# Patient Record
Sex: Female | Born: 1945 | Race: White | Hispanic: No | Smoking: Former smoker
Health system: Southern US, Community
[De-identification: ages and names within clinical notes are randomized; demographics above are authoritative.]

## PROBLEM LIST (undated history)

## (undated) DIAGNOSIS — N289 Disorder of kidney and ureter, unspecified: Secondary | ICD-10-CM

## (undated) DIAGNOSIS — E119 Type 2 diabetes mellitus without complications: Secondary | ICD-10-CM

## (undated) DIAGNOSIS — N390 Urinary tract infection, site not specified: Secondary | ICD-10-CM

## (undated) DIAGNOSIS — I639 Cerebral infarction, unspecified: Secondary | ICD-10-CM

---

## 2016-09-18 ENCOUNTER — Encounter (HOSPITAL_COMMUNITY): Payer: Self-pay | Admitting: Emergency Medicine

## 2016-09-18 ENCOUNTER — Inpatient Hospital Stay (HOSPITAL_COMMUNITY)
Admission: EM | Admit: 2016-09-18 | Discharge: 2016-09-20 | DRG: 640 | Disposition: A | Discharge status: Skilled Nursing Facility | Payer: Medicare Other | Source: Skilled Nursing Facility | Attending: Internal Medicine | Admitting: Internal Medicine

## 2016-09-18 ENCOUNTER — Emergency Department (HOSPITAL_COMMUNITY): Payer: Medicare Other

## 2016-09-18 DIAGNOSIS — Z9115 Patient's noncompliance with renal dialysis: Secondary | ICD-10-CM

## 2016-09-18 DIAGNOSIS — Z87891 Personal history of nicotine dependence: Secondary | ICD-10-CM

## 2016-09-18 DIAGNOSIS — R112 Nausea with vomiting, unspecified: Secondary | ICD-10-CM

## 2016-09-18 DIAGNOSIS — Z91040 Latex allergy status: Secondary | ICD-10-CM

## 2016-09-18 DIAGNOSIS — R55 Syncope and collapse: Secondary | ICD-10-CM | POA: Diagnosis present

## 2016-09-18 DIAGNOSIS — J90 Pleural effusion, not elsewhere classified: Secondary | ICD-10-CM | POA: Diagnosis present

## 2016-09-18 DIAGNOSIS — Z794 Long term (current) use of insulin: Secondary | ICD-10-CM

## 2016-09-18 DIAGNOSIS — E118 Type 2 diabetes mellitus with unspecified complications: Secondary | ICD-10-CM

## 2016-09-18 DIAGNOSIS — E876 Hypokalemia: Secondary | ICD-10-CM | POA: Diagnosis present

## 2016-09-18 DIAGNOSIS — Z888 Allergy status to other drugs, medicaments and biological substances status: Secondary | ICD-10-CM

## 2016-09-18 DIAGNOSIS — Z8673 Personal history of transient ischemic attack (TIA), and cerebral infarction without residual deficits: Secondary | ICD-10-CM

## 2016-09-18 DIAGNOSIS — E1122 Type 2 diabetes mellitus with diabetic chronic kidney disease: Secondary | ICD-10-CM | POA: Diagnosis present

## 2016-09-18 DIAGNOSIS — R0602 Shortness of breath: Secondary | ICD-10-CM

## 2016-09-18 DIAGNOSIS — E162 Hypoglycemia, unspecified: Secondary | ICD-10-CM | POA: Diagnosis present

## 2016-09-18 DIAGNOSIS — D649 Anemia, unspecified: Secondary | ICD-10-CM | POA: Diagnosis present

## 2016-09-18 DIAGNOSIS — Z992 Dependence on renal dialysis: Secondary | ICD-10-CM

## 2016-09-18 DIAGNOSIS — F0390 Unspecified dementia without behavioral disturbance: Secondary | ICD-10-CM | POA: Diagnosis present

## 2016-09-18 DIAGNOSIS — E8770 Fluid overload, unspecified: Principal | ICD-10-CM | POA: Diagnosis present

## 2016-09-18 DIAGNOSIS — K5641 Fecal impaction: Secondary | ICD-10-CM | POA: Diagnosis present

## 2016-09-18 DIAGNOSIS — Z87440 Personal history of urinary (tract) infections: Secondary | ICD-10-CM

## 2016-09-18 DIAGNOSIS — E11649 Type 2 diabetes mellitus with hypoglycemia without coma: Secondary | ICD-10-CM | POA: Diagnosis not present

## 2016-09-18 DIAGNOSIS — Z884 Allergy status to anesthetic agent status: Secondary | ICD-10-CM

## 2016-09-18 DIAGNOSIS — N186 End stage renal disease: Secondary | ICD-10-CM

## 2016-09-18 DIAGNOSIS — E875 Hyperkalemia: Secondary | ICD-10-CM | POA: Diagnosis present

## 2016-09-18 HISTORY — DX: Cerebral infarction, unspecified: I63.9

## 2016-09-18 HISTORY — DX: Urinary tract infection, site not specified: N39.0

## 2016-09-18 HISTORY — DX: Type 2 diabetes mellitus without complications: E11.9

## 2016-09-18 HISTORY — DX: Disorder of kidney and ureter, unspecified: N28.9

## 2016-09-18 LAB — CBC WITH DIFFERENTIAL/PLATELET
Basophils Absolute: 0 10*3/uL (ref 0.0–0.1)
Basophils Relative: 0 %
Eosinophils Absolute: 0 10*3/uL (ref 0.0–0.7)
Eosinophils Relative: 0 %
HEMATOCRIT: 40.1 % (ref 36.0–46.0)
HEMOGLOBIN: 12.2 g/dL (ref 12.0–15.0)
LYMPHS ABS: 0.5 10*3/uL — AB (ref 0.7–4.0)
LYMPHS PCT: 5 %
MCH: 31.1 pg (ref 26.0–34.0)
MCHC: 30.4 g/dL (ref 30.0–36.0)
MCV: 102.3 fL — ABNORMAL HIGH (ref 78.0–100.0)
MONOS PCT: 3 %
Monocytes Absolute: 0.3 10*3/uL (ref 0.1–1.0)
NEUTROS ABS: 9.4 10*3/uL — AB (ref 1.7–7.7)
NEUTROS PCT: 92 %
Platelets: 202 10*3/uL (ref 150–400)
RBC: 3.92 MIL/uL (ref 3.87–5.11)
RDW: 15.6 % — ABNORMAL HIGH (ref 11.5–15.5)
WBC: 10.2 10*3/uL (ref 4.0–10.5)

## 2016-09-18 LAB — COMPREHENSIVE METABOLIC PANEL
ALK PHOS: 69 U/L (ref 38–126)
ALT: 9 U/L — ABNORMAL LOW (ref 14–54)
ANION GAP: 8 (ref 5–15)
AST: 15 U/L (ref 15–41)
Albumin: 3.1 g/dL — ABNORMAL LOW (ref 3.5–5.0)
BILIRUBIN TOTAL: 0.6 mg/dL (ref 0.3–1.2)
BUN: 66 mg/dL — ABNORMAL HIGH (ref 6–20)
CALCIUM: 9.1 mg/dL (ref 8.9–10.3)
CO2: 23 mmol/L (ref 22–32)
CREATININE: 7.68 mg/dL — AB (ref 0.44–1.00)
Chloride: 106 mmol/L (ref 101–111)
GFR calc non Af Amer: 5 mL/min — ABNORMAL LOW (ref >60)
GFR, EST AFRICAN AMERICAN: 6 mL/min — AB (ref >60)
GLUCOSE: 135 mg/dL — AB (ref 65–99)
Potassium: 5.4 mmol/L — ABNORMAL HIGH (ref 3.5–5.1)
SODIUM: 137 mmol/L (ref 135–145)
Total Protein: 6.2 g/dL — ABNORMAL LOW (ref 6.5–8.1)

## 2016-09-18 LAB — I-STAT CHEM 8, ED
BUN: 60 mg/dL — ABNORMAL HIGH (ref 6–20)
CREATININE: 7.9 mg/dL — AB (ref 0.44–1.00)
Calcium, Ion: 1.12 mmol/L — ABNORMAL LOW (ref 1.15–1.40)
Chloride: 106 mmol/L (ref 101–111)
GLUCOSE: 129 mg/dL — AB (ref 65–99)
HEMATOCRIT: 40 % (ref 36.0–46.0)
HEMOGLOBIN: 13.6 g/dL (ref 12.0–15.0)
POTASSIUM: 5.5 mmol/L — AB (ref 3.5–5.1)
Sodium: 140 mmol/L (ref 135–145)
TCO2: 25 mmol/L (ref 22–32)

## 2016-09-18 LAB — CBG MONITORING, ED
GLUCOSE-CAPILLARY: 120 mg/dL — AB (ref 65–99)
GLUCOSE-CAPILLARY: 107 mg/dL — AB (ref 65–99)
Glucose-Capillary: 76 mg/dL (ref 65–99)
Glucose-Capillary: 131 mg/dL — ABNORMAL HIGH (ref 65–99)
Glucose-Capillary: 37 mg/dL — CL (ref 65–99)
Glucose-Capillary: 76 mg/dL (ref 65–99)
Glucose-Capillary: 142 mg/dL — ABNORMAL HIGH (ref 65–99)

## 2016-09-18 LAB — I-STAT TROPONIN, ED: Troponin i, poc: 0 ng/mL (ref 0.00–0.08)

## 2016-09-18 MED ORDER — SODIUM CHLORIDE 0.9 % IV BOLUS (SEPSIS)
500.0000 mL | Freq: Once | INTRAVENOUS | Status: AC
  Administered 2016-09-18: 500 mL via INTRAVENOUS

## 2016-09-18 MED ORDER — DEXTROSE 50 % IV SOLN
1.0000 | Freq: Once | INTRAVENOUS | Status: AC
  Administered 2016-09-18: 50 mL via INTRAVENOUS
  Filled 2016-09-18: qty 50

## 2016-09-18 MED ORDER — DEXTROSE-NACL 5-0.45 % IV SOLN: INTRAVENOUS | Status: DC

## 2016-09-18 MED ORDER — DEXTROSE 50 % IV SOLN
1.0000 | INTRAVENOUS | Status: DC | PRN
  Administered 2016-09-19: 50 mL via INTRAVENOUS

## 2016-09-18 MED ORDER — ONDANSETRON HCL 4 MG/2ML IJ SOLN
4.0000 mg | Freq: Once | INTRAMUSCULAR | Status: AC
  Administered 2016-09-18: 4 mg via INTRAVENOUS
  Filled 2016-09-18: qty 2

## 2016-09-18 NOTE — ED Notes (Signed)
CBG 120 
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
## 2016-09-18 NOTE — ED Provider Notes (Addendum)
[facility]-EMERGENCY DEPT Provider Note   CSN: [id number] Arrival date & time: 09/18/16  1034     History   Chief Complaint Chief Complaint  Patient presents with  . Hypoglycemia  . Emesis    HPI Bridget Manning is a 71 y.o. female.  71 yo F with a chief complaint of hypoglycemia. Patient was found to have a blood sugar in the 20s this morning. She was unresponsive given IV dextrose with improvement. Patient is confused to the initial event. States that she has been having some abdominal pain nausea and vomiting for the past couple days. Denies fevers. Describes diffuse abdominal pain crampy. Denies diarrhea. Denies constipation. She is on dialysis and has been getting it regularly except for today. She denies any change to her medical regimen. Level 5 caveat dementia.  According to the [facility] reviewed from the nursing home the patient has been off of her short acting insulin for the past 4 weeks. She is continued to be on her Lantus. There is no documentation stating why this is.   The history is provided by the patient.  Hypoglycemia  Associated symptoms: vomiting and weakness   Associated symptoms: no dizziness and no shortness of breath   Emesis   Associated symptoms include abdominal pain. Pertinent negatives include no arthralgias, no chills, no fever, no headaches and no myalgias.  Illness  This is a new problem. The current episode started 2 days ago. The problem occurs constantly. The problem has not changed since onset.Associated symptoms include abdominal pain. Pertinent negatives include no chest pain, no headaches and no shortness of breath. Nothing aggravates the symptoms. Nothing relieves the symptoms. She has tried nothing for the symptoms. The treatment provided no relief.    Past Medical History:  Diagnosis Date  . Diabetes mellitus without complication (HCC)   . Renal disorder   . Stroke (HCC)   . UTI (urinary tract infection)     There are no active problems to  display for this patient.   History reviewed. No pertinent surgical history.  OB History    No data available       Home Medications    Prior to Admission medications   Not on File    Family History No family history on file.  Social History Social History  Substance Use Topics  . Smoking status: Former Games developer  . Smokeless tobacco: Never Used  . Alcohol use No     Allergies   Patient has no known allergies.   Review of Systems Review of Systems  Constitutional: Negative for chills and fever.  HENT: Negative for congestion and rhinorrhea.   Eyes: Negative for redness and visual disturbance.  Respiratory: Negative for shortness of breath and wheezing.   Cardiovascular: Negative for chest pain and palpitations.  Gastrointestinal: Positive for abdominal pain, nausea and vomiting.  Genitourinary: Negative for dysuria and urgency.  Musculoskeletal: Negative for arthralgias and myalgias.  Skin: Negative for pallor and wound.  Neurological: Positive for weakness. Negative for dizziness and headaches.     Physical Exam Updated Vital Signs BP (!) 130/51   Pulse 65   Temp 98.6 F (37 C) (Oral)   Resp 16   Ht  (1.651 m)   Wt 68 kg (150 lb)   SpO2 91%   BMI 24.96 kg/m   Physical Exam  Constitutional: She is oriented to person, place, and time. She appears well-developed and well-nourished. No distress.  HENT:  Head: Normocephalic and atraumatic.  Eyes: Pupils are equal,  round, and reactive to light. EOM are normal.  Neck: Normal range of motion. Neck supple.  Cardiovascular: Normal rate and regular rhythm.  Exam reveals no gallop and no friction rub.   No murmur heard. Pulmonary/Chest: Effort normal. She has no wheezes. She has no rales.  Abdominal: Soft. She exhibits no distension and no mass. There is tenderness (diffuse worst to the lower abdomen). There is no guarding.  Musculoskeletal: She exhibits no edema or tenderness.  Neurological: She is  alert and oriented to person, place, and time.  Skin: Skin is warm and dry. She is not diaphoretic.  Psychiatric: She has a normal mood and affect. Her behavior is normal.  Nursing note and vitals reviewed.    ED Treatments / Results  Labs (all labs ordered are listed, but only abnormal results are displayed) Labs Reviewed  CBC WITH DIFFERENTIAL/PLATELET - Abnormal; Notable for the following:       Result Value   MCV 102.3 (*)    RDW 15.6 (*)    Neutro Abs 9.4 (*)    Lymphs Abs 0.5 (*)    All other components within normal limits  COMPREHENSIVE METABOLIC PANEL - Abnormal; Notable for the following:    Potassium 5.4 (*)    Glucose, Bld 135 (*)    BUN 66 (*)    Creatinine, Ser 7.68 (*)    Total Protein 6.2 (*)    Albumin 3.1 (*)    ALT 9 (*)    GFR calc non Af Amer 5 (*)    GFR calc Af Amer 6 (*)    All other components within normal limits  I-STAT CHEM 8, ED - Abnormal; Notable for the following:    Potassium 5.5 (*)    BUN 60 (*)    Creatinine, Ser 7.90 (*)    Glucose, Bld 129 (*)    Calcium, Ion 1.12 (*)    All other components within normal limits  CBG MONITORING, ED - Abnormal; Notable for the following:    Glucose-Capillary 120 (*)    All other components within normal limits  CBG MONITORING, ED - Abnormal; Notable for the following:    Glucose-Capillary 37 (*)    All other components within normal limits  CBG MONITORING, ED - Abnormal; Notable for the following:    Glucose-Capillary 131 (*)    All other components within normal limits  I-STAT TROPONIN, ED  CBG MONITORING, ED    EKG  EKG Interpretation  Date/Time:  Tuesday September 18 2016 11:18:17 EDT Ventricular Rate:  58 PR Interval:  162 QRS Duration: 88 QT Interval:  470 QTC Calculation: 461 R Axis:   77 Text Interpretation:  Sinus bradycardia ST & T wave abnormality, consider inferolateral ischemia Abnormal ECG No old tracing to compare Confirmed by Melene Plan [phone]) on 09/18/2016 11:31:30 AM        Radiology Ct Abdomen Pelvis Wo Contrast  Result Date: 09/18/2016 CLINICAL DATA:  Nausea and vomiting beginning hour ago. Reported lack of abdominal pain. CBG 23 per EMS. EXAM: CT ABDOMEN AND PELVIS WITHOUT CONTRAST TECHNIQUE: Multidetector CT imaging of the abdomen and pelvis was performed following the standard protocol without IV contrast. COMPARISON:  None. FINDINGS: Lower chest: Moderate or large right pleural effusion with multi segment atelectasis. Atherosclerotic calcifications, including the coronary arteries. Simple appearing lipoma in the medial right breast, 5.3 cm in maximal span. On axial slices there appears to be a nodular density within the 12 o'clock superficial right breast, but on coronal reformats this  has angular fading margins typical of parenchyma. Hepatobiliary: No focal liver abnormality.No evidence of biliary obstruction or stone. Pancreas: Unremarkable. Spleen: Unremarkable. Adrenals/Urinary Tract: Bilateral adrenal masses, 4 cm in the left and 23 mm on the right. Both lesions have densitometry consistent with adenoma (-9 on the right and -2 Hounsfield units on the left. No hydronephrosis or stone (renal hilar calcifications appear arterial). Unremarkable bladder. Stomach/Bowel: The rectum is distended by stool, with mesorectal fat edema. No appendicitis. No obstruction. Vascular/Lymphatic: Extensive atherosclerotic calcification. Intimal plaque displacement of the infrarenal aorta compatible with a focal dissection. There has been right iliac artery stenting. No mass or adenopathy. Reproductive:No pathologic findings. Other: No ascites or pneumoperitoneum. Anasarca. Simple subcutaneous lipoma in the right flank, 35 mm in diameter. Musculoskeletal: Nonacute appearing T10, T11, and T12 compression fractures. T12 height loss is the greatest, at least 75%. The fractures show sclerosis and there is neighboring vacuum phenomenon, compatible with remote injuries. No acute or aggressive  finding. IMPRESSION: 1. The rectum is distended by stool, with mesorectal congestion or inflammation. 2. Moderate or large right pleural effusion with multi segment atelectasis. 3. Bilateral adrenal masses with densitometry favoring adenomas, 4 cm on the left and 2.3 cm on the right. 4. Atherosclerosis that is advanced. Evidence of previous infrarenal aortic dissection. 5. T10, T11, and T12 compression fractures with nonacute appearance. 6. Reportedly the patient is visiting the area. At transfer, findings can be correlated with local imaging. Electronically Signed   By: Marnee Spring M.D.   On: 09/18/2016 12:43    Procedures Fecal disimpaction Date/Time: 09/18/2016 1:09 PM Performed by: Adela Lank Lizbet Cirrincione Authorized by: Melene Plan  Consent: Verbal consent obtained. Consent given by: patient Patient understanding: patient does not state understanding of the procedure being performed Patient consent: the patient's understanding of the procedure does not match consent given Required items: required blood products, implants, devices, and special equipment available Patient identity confirmed: verbally with patient Time out: Immediately prior to procedure a "time out" was called to verify the correct patient, procedure, equipment, support staff and site/side marked as required. Local anesthesia used: no  Anesthesia: Local anesthesia used: no  Sedation: Patient sedated: no Patient tolerance: Patient tolerated the procedure well with no immediate complications    (including critical care time)  Medications Ordered in ED Medications  dextrose 5 %-0.45 % sodium chloride infusion (not administered)  sodium chloride 0.9 % bolus 500 mL (0 mLs Intravenous Stopped 09/18/16 1534)  ondansetron (ZOFRAN) injection 4 mg (4 mg Intravenous Given 09/18/16 1117)  dextrose 50 % solution 50 mL (50 mLs Intravenous Given 09/18/16 1538)     Initial Impression / Assessment and Plan / ED Course  I have reviewed the  triage vital signs and the nursing notes.  Pertinent labs & imaging results that were available during my care of the patient were reviewed by me and considered in my medical decision making (see chart for details).     71 yo F with a cc of hypoglycemia.  Patient with limited hx due to dementia.  Abdominal pain on exam will ct.  Given small fluid bolus.    Hypoglycemia resolved an not reoccuring.  CT without concerning finding for abdominal pain.  Noted to have fecal impaction on ct.  Removed at bedside.    Patient still having some trouble tolerating PO in the ED.  Repeat glucose 37.  Given D50, started on D5 gtt.  Admit.   CRITICAL CARE Performed by: Rae Roam   Total critical care  time: 80 minutes  Critical care time was exclusive of separately billable procedures and treating other patients.  Critical care was necessary to treat or prevent imminent or life-threatening deterioration.  Critical care was time spent personally by me on the following activities: development of treatment plan with patient and/or surrogate as well as nursing, discussions with consultants, evaluation of patient's response to treatment, examination of patient, obtaining history from patient or surrogate, ordering and performing treatments and interventions, ordering and review of laboratory studies, ordering and review of radiographic studies, pulse oximetry and re-evaluation of patient's condition.  The patients results and plan were reviewed and discussed.   Any x-rays performed were independently reviewed by myself.   Differential diagnosis were considered with the presenting HPI.  Medications  dextrose 5 %-0.45 % sodium chloride infusion (not administered)  sodium chloride 0.9 % bolus 500 mL (0 mLs Intravenous Stopped 09/18/16 1534)  ondansetron (ZOFRAN) injection 4 mg (4 mg Intravenous Given 09/18/16 1117)  dextrose 50 % solution 50 mL (50 mLs Intravenous Given 09/18/16 1538)    Vitals:    09/18/16 1035 09/18/16 1038 09/18/16 1044 09/18/16 1543  BP:  (!) 146/86  (!) 130/51  Pulse:  (!) 58  65  Resp:  18  16  Temp:  98.6 F (37 C)    TempSrc:  Oral    SpO2: 96% 100%  91%  Weight:   68 kg (150 lb)   Height:    (1.651 m)     Final diagnoses:  Fecal impaction (HCC)  Nausea and vomiting, intractability of vomiting not specified, unspecified vomiting type  Hypoglycemia    Admission/ observation were discussed with the admitting physician, patient and/or family and they are comfortable with the plan.      Melene Plan, DO 09/18/16 1606

## 2016-09-18 NOTE — ED Notes (Addendum)
Spoke with coordinator [facility] and informed pt last dialysis was on 9/12. MD Adela Lank made aware and states pt is stable to be transported back to [facility] nursing facility and arrange for dialysis today or tomorrow. Coordinator to call dialysis center and call this rn back.

## 2016-09-18 NOTE — ED Notes (Signed)
[facility] called to transport pt back to [facility]
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
## 2016-09-18 NOTE — ED Notes (Signed)
Abby from [facility] called back to state pt has appointment for dialysis tomorrow at 12pm [facility] made aware and report called.
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
## 2016-09-18 NOTE — ED Notes (Signed)
Residents at bedside

## 2016-09-18 NOTE — ED Notes (Signed)
Did patient ekg shown to Dr Adela Lank

## 2016-09-18 NOTE — ED Notes (Signed)
Gave patient orange juice

## 2016-09-19 DIAGNOSIS — E1122 Type 2 diabetes mellitus with diabetic chronic kidney disease: Secondary | ICD-10-CM

## 2016-09-19 DIAGNOSIS — Z884 Allergy status to anesthetic agent status: Secondary | ICD-10-CM | POA: Diagnosis not present

## 2016-09-19 DIAGNOSIS — Z91040 Latex allergy status: Secondary | ICD-10-CM | POA: Diagnosis not present

## 2016-09-19 DIAGNOSIS — Z8673 Personal history of transient ischemic attack (TIA), and cerebral infarction without residual deficits: Secondary | ICD-10-CM | POA: Diagnosis not present

## 2016-09-19 DIAGNOSIS — E162 Hypoglycemia, unspecified: Secondary | ICD-10-CM | POA: Diagnosis not present

## 2016-09-19 DIAGNOSIS — E876 Hypokalemia: Secondary | ICD-10-CM | POA: Diagnosis present

## 2016-09-19 DIAGNOSIS — Z87440 Personal history of urinary (tract) infections: Secondary | ICD-10-CM | POA: Diagnosis not present

## 2016-09-19 DIAGNOSIS — Z9115 Patient's noncompliance with renal dialysis: Secondary | ICD-10-CM | POA: Diagnosis not present

## 2016-09-19 DIAGNOSIS — D649 Anemia, unspecified: Secondary | ICD-10-CM | POA: Diagnosis present

## 2016-09-19 DIAGNOSIS — Z87891 Personal history of nicotine dependence: Secondary | ICD-10-CM | POA: Diagnosis not present

## 2016-09-19 DIAGNOSIS — N186 End stage renal disease: Secondary | ICD-10-CM | POA: Diagnosis present

## 2016-09-19 DIAGNOSIS — J90 Pleural effusion, not elsewhere classified: Secondary | ICD-10-CM | POA: Diagnosis present

## 2016-09-19 DIAGNOSIS — F0390 Unspecified dementia without behavioral disturbance: Secondary | ICD-10-CM | POA: Diagnosis present

## 2016-09-19 DIAGNOSIS — R55 Syncope and collapse: Secondary | ICD-10-CM | POA: Diagnosis present

## 2016-09-19 DIAGNOSIS — Z794 Long term (current) use of insulin: Secondary | ICD-10-CM | POA: Diagnosis not present

## 2016-09-19 DIAGNOSIS — Z992 Dependence on renal dialysis: Secondary | ICD-10-CM | POA: Diagnosis not present

## 2016-09-19 DIAGNOSIS — E8770 Fluid overload, unspecified: Secondary | ICD-10-CM | POA: Diagnosis present

## 2016-09-19 DIAGNOSIS — E875 Hyperkalemia: Secondary | ICD-10-CM | POA: Diagnosis present

## 2016-09-19 DIAGNOSIS — E11649 Type 2 diabetes mellitus with hypoglycemia without coma: Secondary | ICD-10-CM | POA: Diagnosis present

## 2016-09-19 DIAGNOSIS — K5641 Fecal impaction: Secondary | ICD-10-CM

## 2016-09-19 DIAGNOSIS — Z888 Allergy status to other drugs, medicaments and biological substances status: Secondary | ICD-10-CM | POA: Diagnosis not present

## 2016-09-19 LAB — HEMOGLOBIN A1C
HEMOGLOBIN A1C: 4.6 % — AB (ref 4.8–5.6)
MEAN PLASMA GLUCOSE: 85.32 mg/dL

## 2016-09-19 LAB — BASIC METABOLIC PANEL
Anion gap: 14 (ref 5–15)
BUN: 71 mg/dL — AB (ref 6–20)
CALCIUM: 8.6 mg/dL — AB (ref 8.9–10.3)
CHLORIDE: 104 mmol/L (ref 101–111)
CO2: 18 mmol/L — ABNORMAL LOW (ref 22–32)
CREATININE: 7.84 mg/dL — AB (ref 0.44–1.00)
GFR calc Af Amer: 5 mL/min — ABNORMAL LOW (ref >60)
GFR calc non Af Amer: 5 mL/min — ABNORMAL LOW (ref >60)
Glucose, Bld: 86 mg/dL (ref 65–99)
Potassium: 7 mmol/L (ref 3.5–5.1)
SODIUM: 136 mmol/L (ref 135–145)

## 2016-09-19 LAB — GLUCOSE, CAPILLARY
GLUCOSE-CAPILLARY: 77 mg/dL (ref 65–99)
GLUCOSE-CAPILLARY: 133 mg/dL — AB (ref 65–99)
GLUCOSE-CAPILLARY: 73 mg/dL (ref 65–99)
GLUCOSE-CAPILLARY: 94 mg/dL (ref 65–99)
GLUCOSE-CAPILLARY: 103 mg/dL — AB (ref 65–99)
Glucose-Capillary: 80 mg/dL (ref 65–99)
Glucose-Capillary: 41 mg/dL — CL (ref 65–99)
Glucose-Capillary: 130 mg/dL — ABNORMAL HIGH (ref 65–99)

## 2016-09-19 LAB — RENAL FUNCTION PANEL
ALBUMIN: 3 g/dL — AB (ref 3.5–5.0)
ANION GAP: 9 (ref 5–15)
Albumin: 2.7 g/dL — ABNORMAL LOW (ref 3.5–5.0)
Anion gap: 8 (ref 5–15)
BUN: 71 mg/dL — ABNORMAL HIGH (ref 6–20)
BUN: 21 mg/dL — ABNORMAL HIGH (ref 6–20)
CHLORIDE: 103 mmol/L (ref 101–111)
CO2: 23 mmol/L (ref 22–32)
CO2: 27 mmol/L (ref 22–32)
CREATININE: 8.28 mg/dL — AB (ref 0.44–1.00)
Calcium: 8.3 mg/dL — ABNORMAL LOW (ref 8.9–10.3)
Calcium: 8.3 mg/dL — ABNORMAL LOW (ref 8.9–10.3)
Chloride: 98 mmol/L — ABNORMAL LOW (ref 101–111)
Creatinine, Ser: 3.6 mg/dL — ABNORMAL HIGH (ref 0.44–1.00)
GFR calc non Af Amer: 4 mL/min — ABNORMAL LOW (ref >60)
GFR, EST AFRICAN AMERICAN: 5 mL/min — AB (ref >60)
GFR, EST AFRICAN AMERICAN: 14 mL/min — AB (ref >60)
GFR, EST NON AFRICAN AMERICAN: 12 mL/min — AB (ref >60)
Glucose, Bld: 128 mg/dL — ABNORMAL HIGH (ref 65–99)
Glucose, Bld: 104 mg/dL — ABNORMAL HIGH (ref 65–99)
PHOSPHORUS: 3.5 mg/dL (ref 2.5–4.6)
POTASSIUM: 4.5 mmol/L (ref 3.5–5.1)
Phosphorus: 6.7 mg/dL — ABNORMAL HIGH (ref 2.5–4.6)
Potassium: 6.5 mmol/L (ref 3.5–5.1)
Sodium: 134 mmol/L — ABNORMAL LOW (ref 135–145)
Sodium: 134 mmol/L — ABNORMAL LOW (ref 135–145)

## 2016-09-19 LAB — CBC
HCT: 34.8 % — ABNORMAL LOW (ref 36.0–46.0)
Hemoglobin: 10.5 g/dL — ABNORMAL LOW (ref 12.0–15.0)
MCH: 30.9 pg (ref 26.0–34.0)
MCHC: 30.2 g/dL (ref 30.0–36.0)
MCV: 102.4 fL — AB (ref 78.0–100.0)
PLATELETS: 201 10*3/uL (ref 150–400)
RBC: 3.4 MIL/uL — ABNORMAL LOW (ref 3.87–5.11)
RDW: 15.8 % — AB (ref 11.5–15.5)
WBC: 5.9 10*3/uL (ref 4.0–10.5)

## 2016-09-19 LAB — MRSA PCR SCREENING: MRSA by PCR: POSITIVE — AB

## 2016-09-19 MED ORDER — INSULIN ASPART 100 UNIT/ML ~~LOC~~ SOLN: 0.0000 [IU] | Freq: Three times a day (TID) | SUBCUTANEOUS | Status: DC

## 2016-09-19 MED ORDER — CAMPHOR-MENTHOL 0.5-0.5 % EX LOTN
TOPICAL_LOTION | CUTANEOUS | Status: DC | PRN
  Filled 2016-09-19: qty 222

## 2016-09-19 MED ORDER — HEPARIN SODIUM (PORCINE) 5000 UNIT/ML IJ SOLN
5000.0000 [IU] | Freq: Three times a day (TID) | INTRAMUSCULAR | Status: DC
  Administered 2016-09-19 – 2016-09-20 (×4): 5000 [IU] via SUBCUTANEOUS
  Filled 2016-09-19 (×4): qty 1

## 2016-09-19 MED ORDER — DEXTROSE 50 % IV SOLN
INTRAVENOUS | Status: AC
  Filled 2016-09-19: qty 50

## 2016-09-19 MED ORDER — SODIUM POLYSTYRENE SULFONATE 15 GM/60ML PO SUSP
15.0000 g | Freq: Once | ORAL | Status: DC
  Filled 2016-09-19: qty 60

## 2016-09-19 MED ORDER — ACETAMINOPHEN 325 MG PO TABS
ORAL_TABLET | ORAL | Status: AC
  Filled 2016-09-19: qty 2

## 2016-09-19 MED ORDER — ACETAMINOPHEN 650 MG RE SUPP: 650.0000 mg | Freq: Four times a day (QID) | RECTAL | Status: DC | PRN

## 2016-09-19 MED ORDER — ACETAMINOPHEN 325 MG PO TABS
650.0000 mg | ORAL_TABLET | Freq: Four times a day (QID) | ORAL | Status: DC | PRN
  Administered 2016-09-19: 650 mg via ORAL

## 2016-09-19 MED ORDER — SENNOSIDES-DOCUSATE SODIUM 8.6-50 MG PO TABS: 1.0000 | ORAL_TABLET | Freq: Every evening | ORAL | Status: DC | PRN

## 2016-09-19 MED ORDER — CHLORHEXIDINE GLUCONATE CLOTH 2 % EX PADS
6.0000 | MEDICATED_PAD | Freq: Every day | CUTANEOUS | Status: DC
  Administered 2016-09-19 – 2016-09-20 (×2): 6 via TOPICAL

## 2016-09-19 MED ORDER — MUPIROCIN 2 % EX OINT
1.0000 "application " | TOPICAL_OINTMENT | Freq: Two times a day (BID) | CUTANEOUS | Status: DC
  Administered 2016-09-19 (×2): 1 via NASAL
  Filled 2016-09-19 (×2): qty 22
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
## 2016-09-19 NOTE — Procedures (Signed)
I was present at this dialysis session. I have reviewed the session itself and made appropriate changes.   K = 7. 1K bath x7min follow K.  Need to sort out SNF and local HD issues vs when she can return to [facility]. Qb 350.   Likely to need HD tomorrow.  Will reassess.    Filed Weights   09/18/16 1044 09/19/16 0509  Weight: 68 kg (150 lb) 73.9 kg (162 lb 14.4 oz)     Recent Labs Lab 09/19/16 0430  NA 136  K 7.0*  CL 104  CO2 18*  GLUCOSE 86  BUN 71*  CREATININE 7.84*  CALCIUM 8.6*     Recent Labs Lab 09/18/16 1058 09/18/16 1105 09/19/16 0430  WBC 10.2  --  5.9  NEUTROABS 9.4*  --   --   HGB 12.2 13.6 10.5*  HCT 40.1 40.0 34.8*  MCV 102.3*  --  102.4*  PLT 202  --  201    Scheduled Meds: . Chlorhexidine Gluconate Cloth  6 each Topical Q0600  . heparin  5,000 Units Subcutaneous Q8H  . mupirocin ointment  1 application Nasal BID  . sodium polystyrene  15 g Oral Once   Continuous Infusions: PRN Meds:.acetaminophen **OR** acetaminophen, camphor-menthol, dextrose, senna-docusate   Sabra Heck  MD 09/19/2016, 8:20 AM
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
## 2016-09-19 NOTE — Progress Notes (Signed)
CRITICAL VALUE ALERT  Critical Value:  K+ 7.0  Date & Time Notied:  09/19/2016 6:11 AM  Provider Notified: Chundi  Orders Received/Actions taken: New orders placed

## 2016-09-19 NOTE — Progress Notes (Signed)
Pt arrived to unit and identified properly. Assessed, oriented to unit, and placed on telemetry. Vital signs stable and pt denies pain. Pt resting at this time. Will continue to monitor. Received report from ED.

## 2016-09-19 NOTE — Progress Notes (Signed)
Kayexalate ordered for elevated potassium of 7.0. Hemodialysis unsure of when they will be able to take pt. Shortly after, HD called with instructions to hold Kayexalate. They will try to get pt to HD asap. Dr. notified.

## 2016-09-19 NOTE — Progress Notes (Addendum)
Hypoglycemic Event  CBG: 41  Treatment: D50 IV 50 mL  Symptoms: None  Follow-up CBG: Time: HD RN CBG Result:Will do in HD  Possible Reasons for Event: Unknown  Comments/MD notified:Pt transported to Hemodialysis department, Alert and oriented x 4, asymptomatic. Hemodialysis RN notified to fallow on CBG at 0730.  MD notified about event.    Savayah Waltrip N Djuana Littleton

## 2016-09-20 ENCOUNTER — Inpatient Hospital Stay (HOSPITAL_COMMUNITY): Payer: Medicare Other

## 2016-09-20 DIAGNOSIS — N186 End stage renal disease: Secondary | ICD-10-CM

## 2016-09-20 DIAGNOSIS — Z992 Dependence on renal dialysis: Secondary | ICD-10-CM

## 2016-09-20 DIAGNOSIS — E162 Hypoglycemia, unspecified: Secondary | ICD-10-CM

## 2016-09-20 LAB — CBC
HEMATOCRIT: 34.9 % — AB (ref 36.0–46.0)
Hemoglobin: 10.6 g/dL — ABNORMAL LOW (ref 12.0–15.0)
MCH: 31.1 pg (ref 26.0–34.0)
MCHC: 30.4 g/dL (ref 30.0–36.0)
MCV: 102.3 fL — AB (ref 78.0–100.0)
PLATELETS: 195 10*3/uL (ref 150–400)
RBC: 3.41 MIL/uL — ABNORMAL LOW (ref 3.87–5.11)
RDW: 15.4 % (ref 11.5–15.5)
WBC: 5.3 10*3/uL (ref 4.0–10.5)

## 2016-09-20 LAB — RENAL FUNCTION PANEL
ALBUMIN: 2.8 g/dL — AB (ref 3.5–5.0)
Anion gap: 8 (ref 5–15)
BUN: 28 mg/dL — ABNORMAL HIGH (ref 6–20)
CO2: 28 mmol/L (ref 22–32)
Calcium: 8.6 mg/dL — ABNORMAL LOW (ref 8.9–10.3)
Chloride: 98 mmol/L — ABNORMAL LOW (ref 101–111)
Creatinine, Ser: 4.5 mg/dL — ABNORMAL HIGH (ref 0.44–1.00)
GFR, EST AFRICAN AMERICAN: 11 mL/min — AB (ref >60)
GFR, EST NON AFRICAN AMERICAN: 9 mL/min — AB (ref >60)
Glucose, Bld: 90 mg/dL (ref 65–99)
PHOSPHORUS: 4.9 mg/dL — AB (ref 2.5–4.6)
POTASSIUM: 5 mmol/L (ref 3.5–5.1)
Sodium: 134 mmol/L — ABNORMAL LOW (ref 135–145)

## 2016-09-20 LAB — HEPATITIS B SURFACE ANTIGEN: Hepatitis B Surface Ag: NEGATIVE

## 2016-09-20 LAB — GLUCOSE, CAPILLARY
GLUCOSE-CAPILLARY: 95 mg/dL (ref 65–99)
GLUCOSE-CAPILLARY: 82 mg/dL (ref 65–99)
Glucose-Capillary: 81 mg/dL (ref 65–99)

## 2016-09-20 MED ORDER — SENNOSIDES-DOCUSATE SODIUM 8.6-50 MG PO TABS: 1.0000 | ORAL_TABLET | Freq: Every evening | ORAL | 1 refills | Status: DC | PRN

## 2016-09-20 NOTE — Discharge Instructions (Signed)
TAKE 8 CAPFULS OF MIRALAX IN A 32 OUNCE GATORADE AND DRINK THE WHOLE BEVERAGE. Also do a fleets enema.  Return for worsening abdominal pain, fever.   I would hold the lantus until reevaluated by your PCP.

## 2016-09-20 NOTE — Progress Notes (Signed)
  Date: 09/20/2016  Patient name: Bridget Manning  Medical record number: [id number]  Date of birth: 18-Oct-1945   I have seen and evaluated this patient and I have discussed the plan of care with the house staff. Please see Dr. Godfrey Pick note for complete details. I concur with his findings.    Inez Catalina, MD 09/20/2016, 5:00 PM

## 2016-09-20 NOTE — NC FL2 (Signed)
[place] MEDICAID FL2 LEVEL OF CARE SCREENING TOOL     IDENTIFICATION  Patient Name: Bridget Manning Birthdate: 11-01-1945 Sex: female Admission Date (Current Location): 09/18/2016  [place] and [place] Number:   Information systems manager)   Facility and Address:  [facility], [address], [place], [place] [address]      Provider Number: [id number]  Attending Physician Name and Address:  Inez Catalina, MD  Relative Name and Phone Number:       Current Level of Care: Hospital Recommended Level of Care: Skilled Nursing Facility Prior Approval Number:    Date Approved/Denied:   PASRR Number: [id number]  Discharge Plan: SNF    Current Diagnoses: Patient Active Problem List   Diagnosis Date Noted  . Fecal impaction (HCC)   . ESRD (end stage renal disease) on dialysis (HCC) 09/18/2016  . Diabetes mellitus with complication (HCC) 09/18/2016  . History of UTI 09/18/2016  . Hypoglycemia 09/18/2016    Orientation RESPIRATION BLADDER Height & Weight     Self, Place, Situation  Normal Incontinent Weight: 71 kg (156 lb 8.4 oz) Height:   (165.1 cm)  BEHAVIORAL SYMPTOMS/MOOD NEUROLOGICAL BOWEL NUTRITION STATUS      Continent Diet (Please see DC Summary)  AMBULATORY STATUS COMMUNICATION OF NEEDS Skin   Limited Assist Verbally Normal                       Personal Care Assistance Level of Assistance  Bathing, Feeding, Dressing Bathing Assistance: Limited assistance Feeding assistance: Independent Dressing Assistance: Limited assistance     Functional Limitations Info             SPECIAL CARE FACTORS FREQUENCY  PT (By licensed PT)     PT Frequency: not assessed              Contractures      Additional Factors Info  Code Status, Allergies, Isolation Precautions Code Status Info: Full Allergies Info: Novocain Procaine, Latex     Isolation Precautions Info: Contact precautions      Current Medications (09/20/2016):  This is  the current hospital active medication list Current Facility-Administered Medications  Medication Dose Route Frequency Provider Last Rate Last Dose  . acetaminophen (TYLENOL) tablet 650 mg  650 mg Oral Q6H PRN Arnetha Courser, MD   650 mg at 09/19/16 8413   Or  . acetaminophen (TYLENOL) suppository 650 mg  650 mg Rectal Q6H PRN Arnetha Courser, MD      . camphor-menthol (SARNA) lotion   Topical PRN Lorenso Courier, MD      . Chlorhexidine Gluconate Cloth 2 % PADS 6 each  6 each Topical Q0600 Inez Catalina, MD   6 each at 09/20/16 [phone]  . dextrose 50 % solution 50 mL  1 ampule Intravenous PRN Arnetha Courser, MD   50 mL at 09/19/16 0715  . heparin injection 5,000 Units  5,000 Units Subcutaneous Q8H Arnetha Courser, MD   5,000 Units at 09/20/16 0526  . insulin aspart (novoLOG) injection 0-15 Units  0-15 Units Subcutaneous TID WC Harbrecht, Lawrence, MD      . mupirocin ointment (BACTROBAN) 2 % 1 application  1 application Nasal BID Inez Catalina, MD   1 application at 09/19/16 2258  . senna-docusate (Senokot-S) tablet 1 tablet  1 tablet Oral QHS PRN Arnetha Courser, MD      . sodium polystyrene (KAYEXALATE) 15 GM/60ML suspension 15 g  15 g Oral Once Eulah Pont, MD  Discharge Medications: Please see discharge summary for a list of discharge medications.  Relevant Imaging Results:  Relevant Lab Results:   Additional Information SSN: [id number] [place], [place]
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
## 2016-09-20 NOTE — Progress Notes (Signed)
[name] to be D/C'd Skilled nursing facility per MD order.  Discussed with the patient and all questions fully answered.  VSS, Skin clean, dry and intact without evidence of skin break down, no evidence of skin tears noted. IV catheter discontinued intact. Site without signs and symptoms of complications. Dressing and pressure applied.  Report called to Kennyth Arnold, Charity fundraiser at [facility]. All questions answered.  Patient escorted via PTAR.  [name] 09/20/2016 3:38 PM
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
## 2016-09-20 NOTE — Progress Notes (Signed)
Patient will DC to: [facility]  Anticipated DC date: 09/20/16 Family notified: N/A Transport by: Sharin Mons   Per MD patient ready for DC to [place]. RN, patient, patient's family, and facility notified of DC. Discharge Summary sent to facility. RN given number for report [phone]). DC packet on chart. Ambulance transport requested for patient.   CSW signing off.  Cristobal Goldmann, [place] Clinical Social Worker [phone]
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
## 2016-09-26 ENCOUNTER — Emergency Department (HOSPITAL_COMMUNITY): Payer: Medicare Other

## 2016-09-26 ENCOUNTER — Encounter (HOSPITAL_COMMUNITY): Payer: Self-pay

## 2016-09-26 ENCOUNTER — Inpatient Hospital Stay (HOSPITAL_COMMUNITY): Payer: Medicare Other

## 2016-09-26 ENCOUNTER — Inpatient Hospital Stay (HOSPITAL_COMMUNITY)
Admission: EM | Admit: 2016-09-26 | Discharge: 2016-09-30 | DRG: 871 | Disposition: A | Discharge status: Rehabilitation Facility | Payer: Medicare Other | Attending: Internal Medicine | Admitting: Internal Medicine

## 2016-09-26 DIAGNOSIS — N2581 Secondary hyperparathyroidism of renal origin: Secondary | ICD-10-CM | POA: Diagnosis present

## 2016-09-26 DIAGNOSIS — Z87891 Personal history of nicotine dependence: Secondary | ICD-10-CM | POA: Diagnosis not present

## 2016-09-26 DIAGNOSIS — R197 Diarrhea, unspecified: Secondary | ICD-10-CM | POA: Diagnosis present

## 2016-09-26 DIAGNOSIS — T68XXXA Hypothermia, initial encounter: Secondary | ICD-10-CM

## 2016-09-26 DIAGNOSIS — R7989 Other specified abnormal findings of blood chemistry: Secondary | ICD-10-CM

## 2016-09-26 DIAGNOSIS — Z794 Long term (current) use of insulin: Secondary | ICD-10-CM | POA: Diagnosis not present

## 2016-09-26 DIAGNOSIS — R233 Spontaneous ecchymoses: Secondary | ICD-10-CM | POA: Diagnosis not present

## 2016-09-26 DIAGNOSIS — A419 Sepsis, unspecified organism: Secondary | ICD-10-CM | POA: Diagnosis present

## 2016-09-26 DIAGNOSIS — Z9889 Other specified postprocedural states: Secondary | ICD-10-CM

## 2016-09-26 DIAGNOSIS — E875 Hyperkalemia: Secondary | ICD-10-CM | POA: Diagnosis present

## 2016-09-26 DIAGNOSIS — R4182 Altered mental status, unspecified: Secondary | ICD-10-CM

## 2016-09-26 DIAGNOSIS — Z6822 Body mass index (BMI) 22.0-22.9, adult: Secondary | ICD-10-CM | POA: Diagnosis not present

## 2016-09-26 DIAGNOSIS — E1122 Type 2 diabetes mellitus with diabetic chronic kidney disease: Secondary | ICD-10-CM | POA: Diagnosis present

## 2016-09-26 DIAGNOSIS — Z87440 Personal history of urinary (tract) infections: Secondary | ICD-10-CM

## 2016-09-26 DIAGNOSIS — Z886 Allergy status to analgesic agent status: Secondary | ICD-10-CM

## 2016-09-26 DIAGNOSIS — E11649 Type 2 diabetes mellitus with hypoglycemia without coma: Secondary | ICD-10-CM | POA: Diagnosis present

## 2016-09-26 DIAGNOSIS — M24542 Contracture, left hand: Secondary | ICD-10-CM

## 2016-09-26 DIAGNOSIS — Z992 Dependence on renal dialysis: Secondary | ICD-10-CM

## 2016-09-26 DIAGNOSIS — Z79899 Other long term (current) drug therapy: Secondary | ICD-10-CM

## 2016-09-26 DIAGNOSIS — N390 Urinary tract infection, site not specified: Secondary | ICD-10-CM

## 2016-09-26 DIAGNOSIS — E118 Type 2 diabetes mellitus with unspecified complications: Secondary | ICD-10-CM | POA: Diagnosis present

## 2016-09-26 DIAGNOSIS — E440 Moderate protein-calorie malnutrition: Secondary | ICD-10-CM | POA: Diagnosis present

## 2016-09-26 DIAGNOSIS — M21371 Foot drop, right foot: Secondary | ICD-10-CM | POA: Diagnosis not present

## 2016-09-26 DIAGNOSIS — Z7982 Long term (current) use of aspirin: Secondary | ICD-10-CM

## 2016-09-26 DIAGNOSIS — R6510 Systemic inflammatory response syndrome (SIRS) of non-infectious origin without acute organ dysfunction: Secondary | ICD-10-CM

## 2016-09-26 DIAGNOSIS — Z8673 Personal history of transient ischemic attack (TIA), and cerebral infarction without residual deficits: Secondary | ICD-10-CM | POA: Diagnosis not present

## 2016-09-26 DIAGNOSIS — D649 Anemia, unspecified: Secondary | ICD-10-CM | POA: Diagnosis present

## 2016-09-26 DIAGNOSIS — M21372 Foot drop, left foot: Secondary | ICD-10-CM | POA: Diagnosis not present

## 2016-09-26 DIAGNOSIS — R410 Disorientation, unspecified: Secondary | ICD-10-CM

## 2016-09-26 DIAGNOSIS — E8770 Fluid overload, unspecified: Secondary | ICD-10-CM | POA: Diagnosis present

## 2016-09-26 DIAGNOSIS — R011 Cardiac murmur, unspecified: Secondary | ICD-10-CM | POA: Diagnosis not present

## 2016-09-26 DIAGNOSIS — Z91040 Latex allergy status: Secondary | ICD-10-CM | POA: Diagnosis not present

## 2016-09-26 DIAGNOSIS — N186 End stage renal disease: Secondary | ICD-10-CM | POA: Diagnosis present

## 2016-09-26 DIAGNOSIS — J90 Pleural effusion, not elsewhere classified: Secondary | ICD-10-CM | POA: Diagnosis present

## 2016-09-26 DIAGNOSIS — Z884 Allergy status to anesthetic agent status: Secondary | ICD-10-CM

## 2016-09-26 DIAGNOSIS — E162 Hypoglycemia, unspecified: Secondary | ICD-10-CM

## 2016-09-26 DIAGNOSIS — H5700 Unspecified anomaly of pupillary function: Secondary | ICD-10-CM

## 2016-09-26 LAB — COMPREHENSIVE METABOLIC PANEL
ALBUMIN: 2.3 g/dL — AB (ref 3.5–5.0)
ALT: 7 U/L — ABNORMAL LOW (ref 14–54)
ANION GAP: 9 (ref 5–15)
AST: 14 U/L — ABNORMAL LOW (ref 15–41)
Alkaline Phosphatase: 45 U/L (ref 38–126)
BILIRUBIN TOTAL: 0.6 mg/dL (ref 0.3–1.2)
BUN: 28 mg/dL — ABNORMAL HIGH (ref 6–20)
CHLORIDE: 96 mmol/L — AB (ref 101–111)
CO2: 24 mmol/L (ref 22–32)
Calcium: 7.5 mg/dL — ABNORMAL LOW (ref 8.9–10.3)
Creatinine, Ser: 4.93 mg/dL — ABNORMAL HIGH (ref 0.44–1.00)
GFR calc Af Amer: 9 mL/min — ABNORMAL LOW (ref >60)
GFR, EST NON AFRICAN AMERICAN: 8 mL/min — AB (ref >60)
Glucose, Bld: 1698 mg/dL (ref 65–99)
Potassium: 4.2 mmol/L (ref 3.5–5.1)
Sodium: 129 mmol/L — ABNORMAL LOW (ref 135–145)
TOTAL PROTEIN: 4.5 g/dL — AB (ref 6.5–8.1)

## 2016-09-26 LAB — CBG MONITORING, ED
GLUCOSE-CAPILLARY: 44 mg/dL — AB (ref 65–99)
GLUCOSE-CAPILLARY: 76 mg/dL (ref 65–99)
Glucose-Capillary: 113 mg/dL — ABNORMAL HIGH (ref 65–99)
Glucose-Capillary: 100 mg/dL — ABNORMAL HIGH (ref 65–99)
Glucose-Capillary: 98 mg/dL (ref 65–99)
Glucose-Capillary: 99 mg/dL (ref 65–99)

## 2016-09-26 LAB — PROTIME-INR
INR: 1.05
PROTHROMBIN TIME: 13.6 s (ref 11.4–15.2)

## 2016-09-26 LAB — BASIC METABOLIC PANEL WITH GFR
Anion gap: 11 (ref 5–15)
BUN: 35 mg/dL — ABNORMAL HIGH (ref 6–20)
CO2: 21 mmol/L — ABNORMAL LOW (ref 22–32)
Calcium: 8.6 mg/dL — ABNORMAL LOW (ref 8.9–10.3)
Chloride: 103 mmol/L (ref 101–111)
Creatinine, Ser: 5.07 mg/dL — ABNORMAL HIGH (ref 0.44–1.00)
GFR calc Af Amer: 9 mL/min — ABNORMAL LOW
GFR calc non Af Amer: 8 mL/min — ABNORMAL LOW
Glucose, Bld: 92 mg/dL (ref 65–99)
Potassium: 5.7 mmol/L — ABNORMAL HIGH (ref 3.5–5.1)
Sodium: 135 mmol/L (ref 135–145)

## 2016-09-26 LAB — CBC
HCT: 35.9 % — ABNORMAL LOW (ref 36.0–46.0)
HEMOGLOBIN: 10.8 g/dL — AB (ref 12.0–15.0)
MCH: 30.3 pg (ref 26.0–34.0)
MCHC: 30.1 g/dL (ref 30.0–36.0)
MCV: 100.8 fL — AB (ref 78.0–100.0)
Platelets: 153 10*3/uL (ref 150–400)
RBC: 3.56 MIL/uL — AB (ref 3.87–5.11)
RDW: 14.6 % (ref 11.5–15.5)
WBC: 4.3 10*3/uL (ref 4.0–10.5)

## 2016-09-26 LAB — CBC WITH DIFFERENTIAL/PLATELET
BASOS ABS: 0 10*3/uL (ref 0.0–0.1)
Basophils Relative: 0 %
EOS ABS: 0.1 10*3/uL (ref 0.0–0.7)
Eosinophils Relative: 2 %
HCT: 37.6 % (ref 36.0–46.0)
HEMOGLOBIN: 10.1 g/dL — AB (ref 12.0–15.0)
LYMPHS PCT: 18 %
Lymphs Abs: 0.8 10*3/uL (ref 0.7–4.0)
MCH: 31.9 pg (ref 26.0–34.0)
MCHC: 26.9 g/dL — ABNORMAL LOW (ref 30.0–36.0)
MCV: 118.6 fL — ABNORMAL HIGH (ref 78.0–100.0)
Monocytes Absolute: 0.3 10*3/uL (ref 0.1–1.0)
Monocytes Relative: 7 %
NEUTROS PCT: 73 %
Neutro Abs: 3 10*3/uL (ref 1.7–7.7)
Platelets: 124 10*3/uL — ABNORMAL LOW (ref 150–400)
RBC: 3.17 MIL/uL — AB (ref 3.87–5.11)
RDW: 15.8 % — ABNORMAL HIGH (ref 11.5–15.5)
WBC: 4.2 10*3/uL (ref 4.0–10.5)

## 2016-09-26 LAB — I-STAT CG4 LACTIC ACID, ED
LACTIC ACID, VENOUS: 1.12 mmol/L (ref 0.5–1.9)
Lactic Acid, Venous: 0.77 mmol/L (ref 0.5–1.9)

## 2016-09-26 LAB — C DIFFICILE QUICK SCREEN W PCR REFLEX
C DIFFICILE (CDIFF) INTERP: NOT DETECTED
C DIFFICLE (CDIFF) ANTIGEN: NEGATIVE
C Diff toxin: NEGATIVE

## 2016-09-26 LAB — URINALYSIS, ROUTINE W REFLEX MICROSCOPIC
BILIRUBIN URINE: NEGATIVE
GLUCOSE, UA: 50 mg/dL — AB
KETONES UR: NEGATIVE mg/dL
NITRITE: NEGATIVE
PH: 8 (ref 5.0–8.0)
Protein, ur: 100 mg/dL — AB
Specific Gravity, Urine: 1.009 (ref 1.005–1.030)

## 2016-09-26 LAB — GLUCOSE, CAPILLARY
GLUCOSE-CAPILLARY: 92 mg/dL (ref 65–99)
GLUCOSE-CAPILLARY: 96 mg/dL (ref 65–99)

## 2016-09-26 LAB — TROPONIN I
TROPONIN I: 0.25 ng/mL — AB (ref <0.03)
TROPONIN I: 0.21 ng/mL — AB (ref <0.03)

## 2016-09-26 LAB — AMMONIA: AMMONIA: 24 umol/L (ref 9–35)

## 2016-09-26 MED ORDER — VANCOMYCIN HCL IN DEXTROSE 1-5 GM/200ML-% IV SOLN: 1000.0000 mg | Freq: Once | INTRAVENOUS | Status: DC

## 2016-09-26 MED ORDER — HEPARIN SODIUM (PORCINE) 5000 UNIT/ML IJ SOLN
5000.0000 [IU] | Freq: Three times a day (TID) | INTRAMUSCULAR | Status: DC
  Administered 2016-09-26 – 2016-09-30 (×10): 5000 [IU] via SUBCUTANEOUS
  Filled 2016-09-26 (×10): qty 1

## 2016-09-26 MED ORDER — ASPIRIN 81 MG PO CHEW
81.0000 mg | CHEWABLE_TABLET | Freq: Every day | ORAL | Status: DC
  Administered 2016-09-27 – 2016-09-30 (×4): 81 mg via ORAL
  Filled 2016-09-26 (×4): qty 1

## 2016-09-26 MED ORDER — LIDOCAINE HCL (PF) 1 % IJ SOLN: 5.0000 mL | INTRAMUSCULAR | Status: DC | PRN

## 2016-09-26 MED ORDER — SODIUM CHLORIDE 0.9 % IV SOLN: 100.0000 mL | INTRAVENOUS | Status: DC | PRN

## 2016-09-26 MED ORDER — HEPARIN SODIUM (PORCINE) 1000 UNIT/ML DIALYSIS: 20.0000 [IU]/kg | INTRAMUSCULAR | Status: DC | PRN

## 2016-09-26 MED ORDER — HEPARIN SODIUM (PORCINE) 1000 UNIT/ML DIALYSIS: 1000.0000 [IU] | INTRAMUSCULAR | Status: DC | PRN

## 2016-09-26 MED ORDER — VANCOMYCIN HCL IN DEXTROSE 750-5 MG/150ML-% IV SOLN
750.0000 mg | INTRAVENOUS | Status: DC
  Filled 2016-09-26: qty 150

## 2016-09-26 MED ORDER — PENTAFLUOROPROP-TETRAFLUOROETH EX AERO: 1.0000 "application " | INHALATION_SPRAY | CUTANEOUS | Status: DC | PRN

## 2016-09-26 MED ORDER — ATORVASTATIN CALCIUM 40 MG PO TABS
40.0000 mg | ORAL_TABLET | Freq: Every day | ORAL | Status: DC
  Administered 2016-09-26 – 2016-09-29 (×4): 40 mg via ORAL
  Filled 2016-09-26 (×4): qty 1

## 2016-09-26 MED ORDER — PIPERACILLIN-TAZOBACTAM 3.375 G IVPB: 3.3750 g | Freq: Two times a day (BID) | INTRAVENOUS | Status: DC

## 2016-09-26 MED ORDER — SODIUM CHLORIDE 0.9 % IV BOLUS (SEPSIS)
1000.0000 mL | Freq: Once | INTRAVENOUS | Status: AC
  Administered 2016-09-26: 1000 mL via INTRAVENOUS

## 2016-09-26 MED ORDER — DEXTROSE 5 % IV SOLN
1.0000 g | INTRAVENOUS | Status: AC
  Administered 2016-09-26 – 2016-09-30 (×5): 1 g via INTRAVENOUS
  Filled 2016-09-26 (×5): qty 10

## 2016-09-26 MED ORDER — LIDOCAINE-PRILOCAINE 2.5-2.5 % EX CREA: 1.0000 "application " | TOPICAL_CREAM | CUTANEOUS | Status: DC | PRN

## 2016-09-26 MED ORDER — PIPERACILLIN-TAZOBACTAM 3.375 G IVPB 30 MIN
3.3750 g | Freq: Once | INTRAVENOUS | Status: AC
  Administered 2016-09-26: 3.375 g via INTRAVENOUS
  Filled 2016-09-26: qty 50

## 2016-09-26 MED ORDER — SODIUM CHLORIDE 0.9 % IV SOLN
62.5000 mg | INTRAVENOUS | Status: DC
  Administered 2016-09-26: 62.5 mg via INTRAVENOUS
  Filled 2016-09-26: qty 5

## 2016-09-26 MED ORDER — SODIUM CHLORIDE 0.9 % IV BOLUS (SEPSIS)
250.0000 mL | Freq: Once | INTRAVENOUS | Status: AC
  Administered 2016-09-26: 250 mL via INTRAVENOUS

## 2016-09-26 MED ORDER — VANCOMYCIN HCL 10 G IV SOLR
1500.0000 mg | Freq: Once | INTRAVENOUS | Status: AC
  Administered 2016-09-26: 1500 mg via INTRAVENOUS
  Filled 2016-09-26: qty 1500

## 2016-09-26 MED ORDER — NALOXONE HCL 2 MG/2ML IJ SOSY
1.0000 mg | PREFILLED_SYRINGE | Freq: Once | INTRAMUSCULAR | Status: AC
  Administered 2016-09-26: 1 mg via INTRAVENOUS
  Filled 2016-09-26: qty 2

## 2016-09-26 MED ORDER — ORAL CARE MOUTH RINSE
15.0000 mL | Freq: Two times a day (BID) | OROMUCOSAL | Status: DC
  Administered 2016-09-26 – 2016-09-29 (×6): 15 mL via OROMUCOSAL

## 2016-09-26 MED ORDER — ALTEPLASE 2 MG IJ SOLR: 2.0000 mg | Freq: Once | INTRAMUSCULAR | Status: DC | PRN

## 2016-09-26 MED ORDER — DEXTROSE 50 % IV SOLN
INTRAVENOUS | Status: AC
  Administered 2016-09-26: 50 mL
  Filled 2016-09-26: qty 50

## 2016-09-26 MED ORDER — NALOXONE HCL 0.4 MG/ML IJ SOLN
INTRAMUSCULAR | Status: AC
  Filled 2016-09-26: qty 1
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
## 2016-09-26 NOTE — ED Notes (Signed)
Phlebotomy at bedside drawing cultures. 
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
## 2016-09-26 NOTE — Procedures (Signed)
I was present at this dialysis session. I have reviewed the session itself and made appropriate changes.   Filed Weights   09/26/16 0154  Weight: 70.8 kg (156 lb)     Recent Labs Lab 09/20/16 0508  09/26/16 0830  NA 134*  < > 135  K 5.0  < > 5.7*  CL 98*  < > 103  CO2 28  < > 21*  GLUCOSE 90  < > 92  BUN 28*  < > 35*  CREATININE 4.50*  < > 5.07*  CALCIUM 8.6*  < > 8.6*  PHOS 4.9*  --   --   < > = values in this interval not displayed.   Recent Labs Lab 09/20/16 1126 09/26/16 0200 09/26/16 0830  WBC 5.3 4.2 4.3  NEUTROABS  --  3.0  --   HGB 10.6* 10.1* 10.8*  HCT 34.9* 37.6 35.9*  MCV 102.3* 118.6* 100.8*  PLT 195 124* 153    Scheduled Meds: Continuous Infusions: . sodium chloride    . sodium chloride    . cefTRIAXone (ROCEPHIN)  IV Stopped (09/26/16 1131)  . ferric gluconate (FERRLECIT/NULECIT) IV     PRN Meds:.sodium chloride, sodium chloride, alteplase, heparin, heparin, heparin, lidocaine (PF), lidocaine-prilocaine, pentafluoroprop-tetrafluoroeth   Irena Cords,  MD 09/26/2016, 2:17 PM
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
## 2016-09-26 NOTE — Progress Notes (Addendum)
Inpatient Diabetes Program Recommendations  AACE/ADA: New Consensus Statement on Inpatient Glycemic Control (2015)  Target Ranges:  Prepandial:   less than 140 mg/dL      Peak postprandial:   less than 180 mg/dL (1-2 hours)      Critically ill patients:  140 - 180 mg/dL   Lab Results  Component Value Date   GLUCAP 99 09/26/2016   HGBA1C 4.6 (L) 09/19/2016   Review of Glycemic Control  Diabetes history: DM 2 Outpatient Diabetes medications: Levemir 4 units Current orders for Inpatient glycemic control: None  Inpatient Diabetes Program Recommendations:    A1c 4.6% low indication hypoglycemia at times. May not need Levemir reordered at time of discharge. Patient not requiring insulin at this time. Recommend monitoring glucose levels for now. Start Novolog Correction if glucose increases above inpatient goal 180 mg/dl.  Thanks,  Christena Deem RN, MSN, [facility] Inpatient Diabetes Coordinator Team Pager [phone] (8a-5p)

## 2016-09-26 NOTE — ED Notes (Signed)
bair hugger applied.

## 2016-09-26 NOTE — ED Notes (Signed)
ED Provider at bedside. 

## 2016-09-26 NOTE — Progress Notes (Signed)
   Subjective: Patient more alert today. Able to identify her name, year, day of week and date. Dose not know her location. Appears slightly more somnolent than her baseline. Denied pain or cough today.   Objective:  Cardiovascular: Grade 3 holosystolic murmur, regular rate Pulmonary: Bilateral lower lung field crackles, not in acute respiratory distress Abdomen: soft, nontender, nondistended Skin: Warm and dry to touch  Assessment/Plan:  Active Problems:   ESRD (end stage renal disease) on dialysis (HCC)   Diabetes mellitus with complication (HCC)   Hypoglycemia associated with diabetes (HCC)   Urinary tract infection  Discontinuing Vancomycin and Zosyn today with ceftriaxone initiated instead for UTI. Patient is afebrile, normal heart rate, blood pressure, respiratory rate and SpO2 93% on room air.   C. Diff negative, enteric precautions discontinued.   Nephrology consulted for dialysis   Dispo: Anticipated discharge in approximately  day(s).   Please see H&P from the same day for additional information.   [name], MD 09/26/2016, 10:30 AM Pager: Pager# [phone]

## 2016-09-27 ENCOUNTER — Inpatient Hospital Stay (HOSPITAL_COMMUNITY): Payer: Medicare Other

## 2016-09-27 LAB — GLUCOSE, CAPILLARY
GLUCOSE-CAPILLARY: 79 mg/dL (ref 65–99)
Glucose-Capillary: 116 mg/dL — ABNORMAL HIGH (ref 65–99)
Glucose-Capillary: 95 mg/dL (ref 65–99)
Glucose-Capillary: 128 mg/dL — ABNORMAL HIGH (ref 65–99)
Glucose-Capillary: 118 mg/dL — ABNORMAL HIGH (ref 65–99)

## 2016-09-27 LAB — URINE CULTURE: CULTURE: NO GROWTH

## 2016-09-27 MED ORDER — RENA-VITE PO TABS
1.0000 | ORAL_TABLET | Freq: Every day | ORAL | Status: DC
  Administered 2016-09-27 – 2016-09-29 (×3): 1 via ORAL
  Filled 2016-09-27 (×3): qty 1

## 2016-09-27 MED ORDER — SEVELAMER CARBONATE 800 MG PO TABS
800.0000 mg | ORAL_TABLET | Freq: Three times a day (TID) | ORAL | Status: DC
  Administered 2016-09-27 – 2016-09-30 (×8): 800 mg via ORAL
  Filled 2016-09-27 (×9): qty 1

## 2016-09-27 MED ORDER — LISINOPRIL 5 MG PO TABS
2.5000 mg | ORAL_TABLET | Freq: Every day | ORAL | Status: DC
  Administered 2016-09-27 – 2016-09-29 (×3): 2.5 mg via ORAL
  Filled 2016-09-27 (×6): qty 1

## 2016-09-27 MED ORDER — PRO-STAT SUGAR FREE PO LIQD
30.0000 mL | Freq: Two times a day (BID) | ORAL | Status: DC
  Administered 2016-09-27 – 2016-09-29 (×4): 30 mL via ORAL
  Filled 2016-09-27 (×6): qty 30

## 2016-09-27 MED ORDER — CARVEDILOL 3.125 MG PO TABS
3.1250 mg | ORAL_TABLET | Freq: Two times a day (BID) | ORAL | Status: DC
  Administered 2016-09-27 – 2016-09-30 (×4): 3.125 mg via ORAL
  Filled 2016-09-27 (×6): qty 1

## 2016-09-27 MED ORDER — NEPRO/CARBSTEADY PO LIQD
237.0000 mL | Freq: Two times a day (BID) | ORAL | Status: DC
  Administered 2016-09-27 – 2016-09-28 (×2): 237 mL via ORAL
  Filled 2016-09-27 (×10): qty 237

## 2016-09-27 NOTE — Progress Notes (Signed)
   Subjective: The patient was resting comfortably in her bed this morning when entering the room. She stated that she felt much better today than she previously had and was glad to see [place] again. The patient granted verbal permission for [place] to contact her sister concerning her current medical care and advancing any care if necessary. The patient again states that she is desiring to be returned to her [facility] so that they may facilitate her transportation to [place] where her sister lives. She denied headache, chest pain, abdominal pain, fever, or chills.  Spoke to her sister Eulah Citizen on the phone for approximately 15 minutes. This sister granted verbal permission to assist the patient as medically necessary. It was explained that a thoracentesis, if performed, could result in hemorrhage, a collapsed lung, infection, and even up to possibly death. The sister stated that she would like for [place] to treat the patient, her sister, sufficiently so that she may return to [place] to be with her family. His sister stated that as far as she was concerned we could perform any procedure that was medically indicated.  We will consult social work to assist with verification of this process and/or permission in writing.  Objective:  Vital signs in last 24 hours: Vitals:   09/27/16 0300 09/27/16 0420 09/27/16 0447 09/27/16 0746  BP: (!) 95/50  (!) 117/55 (!) 137/53  Pulse: 63  62 64  Resp: (!) 21  (!) 26 (!) 21  Temp:  98.2 F (36.8 C)  98.1 F (36.7 C)  TempSrc:  Oral  Oral  SpO2: 96%  94% 96%  Weight:      Height:       ROS negative except as per HPI.  Physical Exam Vital signs reviewed Constitutional: Well-developed, well-nourished, no acute distress Cardiovascular: Regular rate, and rhythm, with distinct holosystolic murmur grade 4 Pulmonary: Normal effort, with bilateral crackles the left and right and decreased lung sounds in the right lower lung field Abdominal: Abdomen is soft,  nontender, nondistended Musculoskeletal: Patient continues to demonstrate footdrop bilaterally, and contraction of the left hand, does not know what caused this   Assessment/Plan:  Active Problems:   ESRD (end stage renal disease) on dialysis (HCC)   Diabetes mellitus with complication (HCC)   Hypoglycemia associated with diabetes (HCC)   Urinary tract infection   Delirium  End-stage renal disease on dialysis: Will continue hemodialysis as per nephrology Nephrology PA stated today that the patient is approximately 7.8 kg above her dry weight Undergoing HD again today Undergoing HD again tomorrow Patient appears to be volume overloaded  Right-sided lower lobe pleural effusion: Patient has imaging evidence of continue right-sided lower lobe effusion Will re-image following third HD session in as many days Consider thoracentesis if pleural effusion fails to resolve  UTI: Will continue ceftriaxone given the patient's acute state  Delirium: Resolved  Diabetes mellitus with complete location: Will discontinue long-acting insulin Will initiate soft sliding scale discharge  Dispo: Anticipated discharge in approximately 1-2 day(s).   [name], MD 09/27/2016, 11:08 AM Pager: Pager# [phone]

## 2016-09-27 NOTE — Progress Notes (Signed)
Patient admitted to the unit. Patient comfortable in bed. Skin assessment completed. Call bell in place.

## 2016-09-27 NOTE — Progress Notes (Signed)
Report called to Carollee Herter, RN on 2W. Patient to transfer to 2W12.
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
## 2016-09-27 NOTE — Progress Notes (Signed)
Report attempted, left call back number for receiving nurse.

## 2016-09-27 NOTE — Progress Notes (Signed)
  Date: 09/27/2016  Patient name: Bridget Manning  Medical record number: [id number]  Date of birth: 11-Dec-1945   I have seen and evaluated this patient and I have discussed the plan of care with the house staff. Please see Dr. Godfrey Pick note for complete details.   [name] has a pleural effusion which could explain some of her SOB.  She will received HD today and tomorrow in order to get closer to her dry weight.  Will plan to repeat CXR after HD tomorrow, if still substantial, consider thoracentesis.   Inez Catalina, MD 09/27/2016, 3:46 PM

## 2016-09-27 NOTE — Progress Notes (Signed)
No changes from full assessment completed on 9/20  CSW confirmed with pt that she is still in agreement for return to [place] when stable.  CSW will continue to follow.  Burna Sis, LCSW Clinical Social Worker [phone]

## 2016-09-28 LAB — RENAL FUNCTION PANEL
ALBUMIN: 2.8 g/dL — AB (ref 3.5–5.0)
ANION GAP: 7 (ref 5–15)
BUN: 32 mg/dL — AB (ref 6–20)
CO2: 28 mmol/L (ref 22–32)
Calcium: 8.7 mg/dL — ABNORMAL LOW (ref 8.9–10.3)
Chloride: 99 mmol/L — ABNORMAL LOW (ref 101–111)
Creatinine, Ser: 4.45 mg/dL — ABNORMAL HIGH (ref 0.44–1.00)
GFR, EST AFRICAN AMERICAN: 11 mL/min — AB (ref >60)
GFR, EST NON AFRICAN AMERICAN: 9 mL/min — AB (ref >60)
Glucose, Bld: 153 mg/dL — ABNORMAL HIGH (ref 65–99)
PHOSPHORUS: 5.8 mg/dL — AB (ref 2.5–4.6)
Potassium: 4.3 mmol/L (ref 3.5–5.1)
SODIUM: 134 mmol/L — AB (ref 135–145)

## 2016-09-28 LAB — CBC
HCT: 33.8 % — ABNORMAL LOW (ref 36.0–46.0)
HEMOGLOBIN: 10.2 g/dL — AB (ref 12.0–15.0)
MCH: 30.4 pg (ref 26.0–34.0)
MCHC: 30.2 g/dL (ref 30.0–36.0)
MCV: 100.9 fL — ABNORMAL HIGH (ref 78.0–100.0)
Platelets: 157 10*3/uL (ref 150–400)
RBC: 3.35 MIL/uL — AB (ref 3.87–5.11)
RDW: 14.5 % (ref 11.5–15.5)
WBC: 4.3 10*3/uL (ref 4.0–10.5)

## 2016-09-28 LAB — COMPREHENSIVE METABOLIC PANEL
ALBUMIN: 3.3 g/dL — AB (ref 3.5–5.0)
ALT: 10 U/L — ABNORMAL LOW (ref 14–54)
AST: 23 U/L (ref 15–41)
Alkaline Phosphatase: 60 U/L (ref 38–126)
Anion gap: 11 (ref 5–15)
BUN: 6 mg/dL (ref 6–20)
CHLORIDE: 94 mmol/L — AB (ref 101–111)
CO2: 28 mmol/L (ref 22–32)
Calcium: 8.2 mg/dL — ABNORMAL LOW (ref 8.9–10.3)
Creatinine, Ser: 1.81 mg/dL — ABNORMAL HIGH (ref 0.44–1.00)
GFR calc Af Amer: 32 mL/min — ABNORMAL LOW (ref >60)
GFR, EST NON AFRICAN AMERICAN: 27 mL/min — AB (ref >60)
Glucose, Bld: 93 mg/dL (ref 65–99)
POTASSIUM: 3.3 mmol/L — AB (ref 3.5–5.1)
Sodium: 133 mmol/L — ABNORMAL LOW (ref 135–145)
Total Bilirubin: 0.9 mg/dL (ref 0.3–1.2)
Total Protein: 6.6 g/dL (ref 6.5–8.1)

## 2016-09-28 LAB — GLUCOSE, CAPILLARY
GLUCOSE-CAPILLARY: 151 mg/dL — AB (ref 65–99)
GLUCOSE-CAPILLARY: 179 mg/dL — AB (ref 65–99)
GLUCOSE-CAPILLARY: 85 mg/dL (ref 65–99)
Glucose-Capillary: 112 mg/dL — ABNORMAL HIGH (ref 65–99)
Glucose-Capillary: 113 mg/dL — ABNORMAL HIGH (ref 65–99)
Glucose-Capillary: 129 mg/dL — ABNORMAL HIGH (ref 65–99)
Glucose-Capillary: 130 mg/dL — ABNORMAL HIGH (ref 65–99)

## 2016-09-28 LAB — LACTATE DEHYDROGENASE: LDH: 224 U/L — AB (ref 98–192)

## 2016-09-28 MED ORDER — ALTEPLASE 2 MG IJ SOLR: 2.0000 mg | Freq: Once | INTRAMUSCULAR | Status: DC | PRN

## 2016-09-28 MED ORDER — LIDOCAINE-PRILOCAINE 2.5-2.5 % EX CREA: 1.0000 "application " | TOPICAL_CREAM | CUTANEOUS | Status: DC | PRN

## 2016-09-28 MED ORDER — DOXERCALCIFEROL 4 MCG/2ML IV SOLN
INTRAVENOUS | Status: AC
  Filled 2016-09-28: qty 2

## 2016-09-28 MED ORDER — PENTAFLUOROPROP-TETRAFLUOROETH EX AERO: 1.0000 "application " | INHALATION_SPRAY | CUTANEOUS | Status: DC | PRN

## 2016-09-28 MED ORDER — SODIUM CHLORIDE 0.9 % IV SOLN: 100.0000 mL | INTRAVENOUS | Status: DC | PRN

## 2016-09-28 MED ORDER — LIDOCAINE HCL (PF) 1 % IJ SOLN: 5.0000 mL | INTRAMUSCULAR | Status: DC | PRN

## 2016-09-28 MED ORDER — HEPARIN SODIUM (PORCINE) 1000 UNIT/ML DIALYSIS: 20.0000 [IU]/kg | INTRAMUSCULAR | Status: DC | PRN

## 2016-09-28 MED ORDER — HEPARIN SODIUM (PORCINE) 1000 UNIT/ML DIALYSIS
20.0000 [IU]/kg | INTRAMUSCULAR | Status: DC | PRN
  Administered 2016-09-28: 1200 [IU] via INTRAVENOUS_CENTRAL

## 2016-09-28 MED ORDER — HEPARIN SODIUM (PORCINE) 1000 UNIT/ML DIALYSIS
1000.0000 [IU] | INTRAMUSCULAR | Status: DC | PRN
  Administered 2016-09-28: 1000 [IU] via INTRAVENOUS_CENTRAL

## 2016-09-28 MED ORDER — HEPARIN SODIUM (PORCINE) 1000 UNIT/ML DIALYSIS: 1000.0000 [IU] | INTRAMUSCULAR | Status: DC | PRN
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
## 2016-09-28 NOTE — Care Management Note (Signed)
Case Management Note  Patient Details  Name: Belicia Difatta MRN: [id number] Date of Birth: [birth year]  Subjective/Objective:    From [facility] SNF, CSW following.                Action/Plan:   Expected Discharge Date:                  Expected Discharge Plan:  Skilled Nursing Facility  In-House Referral:  Clinical Social Work  Discharge planning Services  CM Consult  Post Acute Care Choice:    Choice offered to:     DME Arranged:    DME Agency:     HH Arranged:    HH Agency:     Status of Service:  Completed, signed off  If discussed at [employer], dates discussed:    Additional Comments:  Leone Haven, RN 09/28/2016, 10:43 AM

## 2016-09-28 NOTE — Care Management Important Message (Signed)
Important Message  Patient Details  Name: Bridget Manning MRN: [id number] Date of Birth: 03-19-1945   Medicare Important Message Given:  Yes    Rene Gonsoulin, Annamarie Major, RN 09/28/2016, 5:05 PM

## 2016-09-28 NOTE — Progress Notes (Signed)
HD tx completed @ 1460 w/ bp issues throughout requiring UF off for 18 min of tx and one 282m NS bolus, UF goal not met, blood rinsed back, VSS, report called to SSanta Lighter RN

## 2016-09-28 NOTE — Progress Notes (Signed)
  Date: 09/28/2016  Patient name: Bridget Manning  Medical record number: [id number]  Date of birth: [birth year]   I have seen and evaluated this patient and I have discussed the plan of care with the house staff. Please see Dr. Godfrey Pick note for complete details. I concur with his findings.  Given effusion did not improve with HD yesterday, and HD has not taken place at this time, would go ahead and place order for IR for thoracentesis in hopes it could be done this weekend.   Inez Catalina, MD 09/28/2016, 2:20 PM

## 2016-09-28 NOTE — Progress Notes (Signed)
   Subjective: Patient stated that she felt much better today and the previous day. The patient was able to recall the conversation from the previous day regarding discussing her case with her [name]. She stated that she was aware of her current condition, could recall her location, name, and the date. Patient desires to be discharged to [facility] as soon as she can make it there so that she may be transferred to [facility] eventually.   Objective:  Vital signs in last 24 hours: Vitals:   09/27/16 1634 09/27/16 1742 09/27/16 2025 09/28/16 0423  BP: 134/60 (!) 113/48 127/60 (!) 134/56  Pulse: 70 66 66 60  Resp: Temp: 98.2 F (36.8 C) 98.3 F (36.8 C) 98.3 F (36.8 C) 98.5 F (36.9 C)  TempSrc: Oral Oral Oral Oral  SpO2: 94% 97% 95% 98%  Weight:   135 lb 2.3 oz (61.3 kg)   Height:       ROS negative except as per HPI.  Physical Exam  Constitutional: She appears well-developed and well-nourished. No distress.  Cardiovascular: Regular rhythm.   Murmur heard.  Systolic murmur is present with a grade of 3/6  Pulmonary/Chest: Effort normal. No respiratory distress. She has no wheezes.  Abdominal: Soft. Bowel sounds are normal.  Musculoskeletal: She exhibits no edema.  Neurological: She is alert.  Skin: Skin is warm.  Psychiatric: She has a normal mood and affect.    Assessment/Plan:  Active Problems:   ESRD (end stage renal disease) on dialysis (HCC)   Diabetes mellitus with complication (HCC)   Hypoglycemia associated with diabetes (HCC)   Urinary tract infection   Delirium  End-stage renal disease on dialysis: Will continue hemodialysis as per nephrology Nephrology PA stated today that the patient is approximately 7.8 kg above her dry weight Undergoing HD again today Patient appears to be volume overloaded  Right-sided lower lobe pleural effusion: Patient has imaging evidence of continue right-sided lower lobe effusion Will re-image following  third HD session in as many days Consider thoracentesis if pleural effusion fails to resolve  UTI: Will continue ceftriaxone given the patient's acute state  Delirium: Resolved  Diabetes mellitus with complete location: Will discontinue long-acting insulin Will initiate soft sliding scale discharge  Transportation: I have attempted to consult CSW regarding the patients placement but have not received a return call the second time.   Diet: Renal Fluids: None Dvt PPX: heparin Code: Full Dispo: Anticipated discharge in approximately 1-2 day(s).   [name], MD 09/28/2016, 7:14 AM Pager: Pager# [phone]

## 2016-09-28 NOTE — Progress Notes (Signed)
HD tx initiated via HD cath w/ lines reversed d/t significant resistance on pull on the AP, it push/flush well, VP pull/push/flush w/o problem, VSS, will cont to monitor while on HD tx

## 2016-09-29 ENCOUNTER — Inpatient Hospital Stay (HOSPITAL_COMMUNITY): Payer: Medicare Other

## 2016-09-29 DIAGNOSIS — J90 Pleural effusion, not elsewhere classified: Secondary | ICD-10-CM

## 2016-09-29 DIAGNOSIS — E1122 Type 2 diabetes mellitus with diabetic chronic kidney disease: Secondary | ICD-10-CM

## 2016-09-29 DIAGNOSIS — M21371 Foot drop, right foot: Secondary | ICD-10-CM

## 2016-09-29 DIAGNOSIS — R011 Cardiac murmur, unspecified: Secondary | ICD-10-CM

## 2016-09-29 DIAGNOSIS — Z992 Dependence on renal dialysis: Secondary | ICD-10-CM

## 2016-09-29 DIAGNOSIS — M21372 Foot drop, left foot: Secondary | ICD-10-CM

## 2016-09-29 DIAGNOSIS — E11649 Type 2 diabetes mellitus with hypoglycemia without coma: Secondary | ICD-10-CM

## 2016-09-29 DIAGNOSIS — N186 End stage renal disease: Secondary | ICD-10-CM

## 2016-09-29 DIAGNOSIS — N390 Urinary tract infection, site not specified: Secondary | ICD-10-CM

## 2016-09-29 LAB — HEPATITIS B SURFACE ANTIBODY,QUALITATIVE: HEP B S AB: REACTIVE

## 2016-09-29 LAB — GLUCOSE, CAPILLARY
GLUCOSE-CAPILLARY: 161 mg/dL — AB (ref 65–99)
Glucose-Capillary: 93 mg/dL (ref 65–99)
Glucose-Capillary: 111 mg/dL — ABNORMAL HIGH (ref 65–99)
Glucose-Capillary: 181 mg/dL — ABNORMAL HIGH (ref 65–99)

## 2016-09-29 LAB — GLUCOSE, PLEURAL OR PERITONEAL FLUID: GLUCOSE FL: 130 mg/dL

## 2016-09-29 LAB — BODY FLUID CELL COUNT WITH DIFFERENTIAL
Eos, Fluid: 0 %
LYMPHS FL: 18 %
Monocyte-Macrophage-Serous Fluid: 79 % (ref 50–90)
NEUTROPHIL FLUID: 3 % (ref 0–25)
WBC FLUID: 37 uL (ref 0–1000)

## 2016-09-29 LAB — GRAM STAIN

## 2016-09-29 LAB — ALBUMIN, PLEURAL OR PERITONEAL FLUID: ALBUMIN FL: 2.2 g/dL

## 2016-09-29 LAB — LACTATE DEHYDROGENASE, PLEURAL OR PERITONEAL FLUID: LD, Fluid: 79 U/L — ABNORMAL HIGH (ref 3–23)

## 2016-09-29 LAB — PROTEIN, PLEURAL OR PERITONEAL FLUID: Total protein, fluid: 3.7 g/dL

## 2016-09-29 MED ORDER — ACETAMINOPHEN 325 MG PO TABS: 650.0000 mg | ORAL_TABLET | Freq: Four times a day (QID) | ORAL | Status: DC | PRN

## 2016-09-29 MED ORDER — SODIUM CHLORIDE 0.9 % IV BOLUS (SEPSIS)
250.0000 mL | Freq: Once | INTRAVENOUS | Status: AC
  Administered 2016-09-29: 250 mL via INTRAVENOUS

## 2016-09-29 MED ORDER — ACETAMINOPHEN 325 MG PO TABS
650.0000 mg | ORAL_TABLET | Freq: Once | ORAL | Status: AC
  Administered 2016-09-29: 650 mg via ORAL
  Filled 2016-09-29: qty 2

## 2016-09-29 MED ORDER — LIDOCAINE HCL (PF) 1 % IJ SOLN
INTRAMUSCULAR | Status: AC
  Filled 2016-09-29: qty 30

## 2016-09-29 MED ORDER — POLYETHYLENE GLYCOL 3350 17 G PO PACK
17.0000 g | PACK | Freq: Every day | ORAL | Status: DC
  Administered 2016-09-29: 17 g via ORAL
  Filled 2016-09-29 (×2): qty 1

## 2016-09-29 NOTE — Procedures (Signed)
Ultrasound-guided diagnostic and therapeutic right thoracentesis performed yielding 1.1 liters of serous colored fluid. No immediate complications. Follow-up chest x-ray pending.       Bernadean Saling E 12:56 PM 09/29/2016
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
## 2016-09-29 NOTE — Progress Notes (Signed)
   Subjective:  Patient was feeling better when seen this morning. She was trying to take a nap. She has no complaints.  Objective:  Vital signs in last 24 hours: Vitals:   09/28/16 1900 09/28/16 2018 09/29/16 0441 09/29/16 1036  BP: 134/64 (!) 130/59 (!) 132/53 (!) 135/55  Pulse: 64 70 60 72  Resp: Temp: 98.2 F (36.8 C) 98.5 F (36.9 C) 98.1 F (36.7 C) 98.2 F (36.8 C)  TempSrc: Oral Oral Oral Oral  SpO2: 100% 100% 100% 100%  Weight:      Height:       Gen. Well-developed lady, lying comfortably in her bed, in no acute distress. Chest. Mildly decreased breath sounds on right base. CV. Regular rate and rhythm, systolic murmur. Abdomen. Soft, non tender, bowel sounds positive. Extremities. Bilateral foot drop, no edema, no cyanosis.   Assessment/Plan:  End-stage renal disease on dialysis: continue dialysis according to her schedule.we will have her next dialysis on Monday.  Right-sided lower lobe pleural effusion: she is going for her diagnostic paracentesis today.  UTI: Will continue ceftriaxone for total 5 days, it will be stopped after tomorrow's dose.  Diabetes mellitus with hypoglycemia. Blood sugar remained stable.She should not get any long-acting insulin. We can monitor her blood sugar and if needed can use sensitives scale.  Dispo: Anticipated discharge in approximately 0-1 day(s).   Arnetha Courser, MD 09/29/2016, 12:11 PM Pager: [phone]

## 2016-09-29 NOTE — Progress Notes (Signed)
  Date: 09/29/2016  Patient name: Bridget Manning  Medical record number: [id number]  Date of birth: 05/13/1945   I have seen and evaluated this patient and I have discussed the plan of care with the house staff. Please see their note for complete details. I concur with their findings with the following additions/corrections: The patient was seen on morning rounds. She was not very interactive with Dr. Nelson Chimes. She has had her therapeutic and diagnostic thoracentesis and we will await the results.  Burns Spain, MD 09/29/2016, 2:27 PM

## 2016-09-30 LAB — RENAL FUNCTION PANEL
ANION GAP: 10 (ref 5–15)
Albumin: 2.6 g/dL — ABNORMAL LOW (ref 3.5–5.0)
BUN: 33 mg/dL — ABNORMAL HIGH (ref 6–20)
CALCIUM: 8.6 mg/dL — AB (ref 8.9–10.3)
CO2: 25 mmol/L (ref 22–32)
CREATININE: 4.02 mg/dL — AB (ref 0.44–1.00)
Chloride: 95 mmol/L — ABNORMAL LOW (ref 101–111)
GFR calc non Af Amer: 10 mL/min — ABNORMAL LOW (ref >60)
GFR, EST AFRICAN AMERICAN: 12 mL/min — AB (ref >60)
GLUCOSE: 140 mg/dL — AB (ref 65–99)
PHOSPHORUS: 5.1 mg/dL — AB (ref 2.5–4.6)
Potassium: 3.7 mmol/L (ref 3.5–5.1)
SODIUM: 130 mmol/L — AB (ref 135–145)

## 2016-09-30 LAB — GLUCOSE, CAPILLARY
GLUCOSE-CAPILLARY: 91 mg/dL (ref 65–99)
GLUCOSE-CAPILLARY: 193 mg/dL — AB (ref 65–99)
Glucose-Capillary: 184 mg/dL — ABNORMAL HIGH (ref 65–99)
Glucose-Capillary: 99 mg/dL (ref 65–99)

## 2016-09-30 LAB — PH, BODY FLUID: pH, Body Fluid: 7.6

## 2016-09-30 MED ORDER — OXYCODONE-ACETAMINOPHEN 5-325 MG PO TABS: 1.0000 | ORAL_TABLET | Freq: Two times a day (BID) | ORAL | 0 refills | Status: DC | PRN

## 2016-09-30 MED ORDER — INSULIN ASPART 100 UNIT/ML ~~LOC~~ SOLN: SUBCUTANEOUS | 6 refills | Status: DC

## 2016-09-30 MED ORDER — RENA-VITE PO TABS: 1.0000 | ORAL_TABLET | Freq: Every day | ORAL | 2 refills | Status: DC
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
## 2016-09-30 NOTE — Discharge Summary (Signed)
Name: Bridget Manning MRN: [id number] DOB: [birth year] 71 y.o. 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   aspirin 81 MG chewable tablet Chew 81 mg by mouth daily.   atorvastatin 40 MG tablet Commonly known as:  LIPITOR Take 40 mg by mouth at bedtime.   carvedilol 3.125 MG tablet Commonly known as:  COREG Take 3.125 mg by mouth 2 (two) times daily.   diphenhydrAMINE 25 mg capsule Commonly known as:  BENADRYL Take 25 mg by mouth 3 (three) times daily as needed for itching.   insulin aspart 100 UNIT/ML injection Commonly known as:  NOVOLOG CBG before meals and at bedtime CBG<70 and symptomatic give IV glucose CBG <70 and asymptomatic give glucose or PO glucose  CBG 70-140, do not give insulin CBG 141-180, give one unit short acting  CBG 191-240, give two units short acting  CBG 241-300, give three units short acting  CBG 301-350, give five  units short acting  CBG 351-400 give seven units and contact physician  CBG >401 contact physician See Discharge summary   lisinopril 2.5 MG tablet Commonly known as:  PRINIVIL,ZESTRIL Take 2.5 mg by mouth daily. HOLD IF SYSTOLIC READING IS LESS THAN 409   loperamide 2 MG tablet Commonly known as:  IMODIUM A-D Take 4 mg by mouth every 6 (six) hours as needed for diarrhea or loose stools.   multivitamin Tabs tablet Take 1 tablet by mouth at bedtime.   ondansetron 4 MG tablet Commonly known as:  ZOFRAN Take 4 mg by mouth every 6 (six) hours as needed for nausea or vomiting.   oxyCODONE-acetaminophen 5-325 MG tablet Commonly known as:  PERCOCET/ROXICET Take 1 tablet by mouth 2 (two) times daily as needed for severe pain. What changed:  Another medication with the same name was removed. Continue taking this medication, and follow the directions you see here.   OXYGEN Inhale 2 L into the lungs continuous.   sevelamer carbonate 800 MG tablet Commonly known as:  RENVELA Take 800 mg by mouth 3 (three) times daily with meals.   sodium polystyrene 15 GM/60ML suspension Commonly known as:  KAYEXALATE Take 30 g by mouth once as needed (for elevated potassium). CALL MD PRIOR TO USE            Discharge Care Instructions        Start     Ordered   09/30/16 0000  multivitamin (  RENA-VIT) TABS tablet  Daily at bedtime     09/30/16 1109   09/30/16 0000  insulin aspart (NOVOLOG) 100 UNIT/ML injection     09/30/16 1109   09/30/16 0000  Increase activity slowly     09/30/16 1109   09/30/16 0000  Diet - low sodium heart healthy     09/30/16 1109   09/30/16 0000  Call MD for:  persistant dizziness or light-headedness     09/30/16 1109      Disposition and follow-up:   Bridget Manning was discharged from [facility] in Stable condition.  At the hospital follow up visit please address:  1.  A. Follow-up for diabetes, now without insulin needs, SEE BELOW      B.  Follow-up for HD sessions and volume status, dry weight approximately 62kg?      C. Follow-up for recurrent right sided pleural effusion, with diagnostic thoracentesis and pending final labs  2.  Labs / imaging needed at time of follow-up: Chest x-ray for effusion  3.  Pending labs/ test needing follow-up: Pleural fluid cytology, AFB smear, gram stain  Follow-up Appointments: Follow-up Information    Rogers Seeds, MD Follow up in 1 week(s).   Specialty:  Family Medicine Contact information: [address] [place]  [place] [address] [phone]           Hospital Course by problem list: Active Problems:   ESRD (end stage renal disease) on dialysis [facility])   Diabetes mellitus with complication (HCC)   Hypoglycemia associated with diabetes (HCC)   Urinary tract infection   Delirium   1. ESRD on dialysis Patient is a history of end-stage renal disease on hemodialysis(HD) prior to admission. She was treated by nephrology on a Monday Wednesday and Friday schedule, with additional session this past Thursday September 27th. The patient has dropped 12.7kg in weight since admission. The patient's dyspnea improved significantly since admission following the three HD sessions. Plan to continue HD on her previous MWF schedule.   2. Diabetes Mellitus with complications: Patient her previous history of diabetes mellitus first admission. She did however treated with insulin causing hypoglycemia resulting in alteration in her mental status. It was advised that she shouldn't not be placed on insulin until being with her primary care physician. However insulin was again used, resulting again in altered mental status, requiring significant intervention to maintain her blood glucose levels. As such, it is advised that the patient does not be given insulin except as follows: CBG<70 and symptomatic give IV glucose CBG <70 and asymptomatic give glucose one amp or PO glucose  CBG 70-140, do not give  insulin CBG 141-180, give one unit short acting (ex. aspart, Novolog) & recheck CBG in 30 min  CBG 191-240, give two units short acting & recheck CBG in 30 min  CBG 241-300, give three units short acting & recheck CBG in 30 min  CBG 301-350, give five units short acting & recheck CBG in 30 min (+/- 5 min) CBG 351-400 give seven units and contact physician for additional advice & recheck CBG in 30 min (+/- 5 min) CBG >400 and patient symptomatic contact physician for directions   If the patient's blood glucose levels begin to increase significantly over time, please contact her PCP for additional support and advise and she has now been hospitalized twice for symptomatic(alteration in mental status) hypoglycemia secondary to iatrogenic insulin administration.   3. UTI: The patient was admitted with significant altered mental status, and questionable  urinalysis. Given the indeterminate nature of her symptoms, and urinalysis, her UTI was empirically treated as the patient is chronically ill and her mental status was altered. As of discharge she has received 5 doses of ceftriaxone which will complete antibiotic treatment for the UTI.  4. Delirium: Thought to mostly likely be secondary to severe hypoglycemia and significant volume overload. Urinary tract infection also likely contributed. All of which have resolved as has the delirium. Patient is able to discuss recent and past events, and he stated desire to return to [place] to be with her sister, who is also in agreement with transferring her to [place].   5. Right sided pleural effusion: Patient was noted to have a right-sided pleural effusion on admission, although this significantly improved for multiple hemodialysis sessions and drop them 12 kg in weight, there remained a significant effusion. Diagnostic and therapeutic thoracentesis was performed resulting in retrieval of 1.1 L of serous fluid. Evaluation of the fluid was positive for exudative  process for protein only with a ratio greater than 0.5. At this point, cytology, AFB stain, Gram stain, and fungal cultures are pending.   Discharge Vitals:   BP (!) 101/41 (BP Location: Right Arm)   Pulse 68   Temp 98.1 F (36.7 C) (Oral)   Resp 16   Ht  (1.6 m)   Wt 128 lb 14.4 oz (58.5 kg)   SpO2 100%   BMI 22.83 kg/m   Pertinent Labs, Studies, and Procedures:  CBC Latest Ref Rng & Units 09/28/2016 09/26/2016 09/26/2016  WBC 4.0 - 10.5 K/uL 4.3 4.3 4.2  Hemoglobin 12.0 - 15.0 g/dL 10.2(L) 10.8(L) 10.1(L)  Hematocrit 36.0 - 46.0 % 33.8(L) 35.9(L) 37.6  Platelets 150 - 400 K/uL 157 153 124(L)   CMP Latest Ref Rng & Units 09/30/2016 09/28/2016 09/28/2016  Glucose 65 - 99 mg/dL 696(E) 93 952(W)  BUN 6 - 20 mg/dL 41(L) 6 24(M)  Creatinine 0.44 - 1.00 mg/dL 0.10(U) 7.25(D) 6.64(Q)  Sodium 135 - 145 mmol/L 130(L) 133(L) 134(L)  Potassium 3.5 - 5.1 mmol/L 3.7 3.3(L) 4.3  Chloride 101 - 111 mmol/L 95(L) 94(L) 99(L)  CO2 22 - 32 mmol/L Calcium 8.9 - 10.3 mg/dL 0.3(K) 7.4(Q) 5.9(D)  Total Protein 6.5 - 8.1 g/dL - 6.6 -  Total Bilirubin 0.3 - 1.2 mg/dL - 0.9 -  Alkaline Phos 38 - 126 U/L - 60 -  AST 15 - 41 U/L - 23 -  ALT 14 - 54 U/L - 10(L) -   Chest xray 09/29/2016 IMPRESSION: 1. No pneumothorax. 2. Improving changes of congestive heart failure with mildly decreased right pleural fluid. (S/p thoracentesis)  Chest xray 09/27/2016 IMPRESSION: 1. Aortic atherosclerosis.  2. Stable moderate right pleural effusion is noted with associated atelectasis or infiltrate. (prior to thoracentesis)  CT head wo contrast IMPRESSION: 1. 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 2. Small cortical subacute to chronic infarct in right occipital lobe. 3. Moderate chronic microvascular ischemic changes and parenchymal volume loss of the brain.  Discharge Instructions: Discharge Instructions     Call MD for:  persistant dizziness or light-headedness    Complete by:  As directed    Diet - low sodium heart healthy    Complete by:  As directed    Increase activity slowly    Complete by:  As directed       Signed: [name], MD 09/30/2016, 11:09 AM  Pager: Pager# [phone]

## 2016-09-30 NOTE — Discharge Instructions (Signed)
Please follow-up with a primary care provider within 1-2 weeks for evaluation of your respiratory status and blood glucose monitoring.  If any time. Become lightheaded, confused, or develop other concerning symptoms, please return to the ED or call your primary care physician.  He has been given a new insulin regimen as below: CBG<70 and symptomatic give IV glucose CBG <70 and asymptomatic give glucose one amp or PO glucose  CBG 70-140, do not give insulin CBG 141-180, give one unit short acting (ex. aspart, Novolog) & recheck CBG in 30 min  CBG 191-240, give two units short acting & recheck CBG in 30 min  CBG 241-300, give three units short acting & recheck CBG in 30 min  CBG 301-350, give five units short acting & recheck CBG in 30 min (+/- 5 min) CBG 351-400 give seven units and contact physician for additional advice & recheck CBG in 30 min (+/- 5 min) CBG >400 and patient symptomatic contact physician for directions   If the patient's blood glucose levels begin to increase significantly over time, please contact her PCP for additional support and advise and she has now been hospitalized twice for symptomatic(alteration in mental status) hypoglycemia secondary to iatrogenic insulin administration.  Please follow the above insulin regimen closely. Lease contact the patient's primary care provider with additional questions. YOU WILL DISCONTIUE use of your long-acting insulin.  Thank you for visiting the [facility].

## 2016-09-30 NOTE — Progress Notes (Signed)
   Subjective: The patient is lying in her bed today during the room and her blood pressure record. She denied concerns including headache, chest pain, probable pain, fever, chills, muscle aches, diarrhea, dysuria, or nausea. The patient remains aware of the plan to discharge back to [place] before eventually [place], prior to being transported back to [place] when available. He patient was informed of the results with thoracentesis, and denies additional questions or concerns regarding this.  Objective:  Vital signs in last 24 hours: Vitals:   09/29/16 1630 09/29/16 1638 09/29/16 2049 09/30/16 0606  BP: (!) 84/44 (!) 84/44 (!) 148/56 (!) 127/59  Pulse: 62 61 64 64  Resp:  Temp:  98 F (36.7 C) 97.8 F (36.6 C) 97.8 F (36.6 C)  TempSrc:  Oral Oral Oral  SpO2:  99% 100% 100%  Weight:   128 lb 14.4 oz (58.5 kg)   Height:       ROS negative except as per HPI.  Physical Exam  Nursing note and vitals reviewed. Constitutional: Patient is well-developed, well-nourished, no acute distress Cardiovascular: Regular rate, and rhythm, with a grade 3 holosystolic murmur Pulmonary: Effort, mild crackles lower lung fields bilaterally, no wheezing Abdominal: Abdomen soft, nontender, nondistended, with normal bowel sounds   Assessment/Plan:  Active Problems:   ESRD (end stage renal disease) on dialysis (HCC)   Diabetes mellitus with complication (HCC)   Hypoglycemia associated with diabetes (HCC)   Urinary tract infection   Delirium  ESRD on HD:  Continue dialysis as per nephrology Completed a session Friday on a MWF schedule  Right-sided lower lobe pleural effusion: IT performed thoracentesis  Labs sent, Protein ratio indicating exudative fluid, gram negative, will await cytology Patient denied chest pain, dyspnea today   UTI:  Asymptomatic, given severity and chronicity of the illness with associated symptoms, plan is to give one more(fifth) dose of ceftriaxone  today.  Diabetes: Due to hypoglycemia, will initiate only a sensitive sliding scale regimen for the remainder of the hospital stay and upon discharge   Dispo: Anticipated discharge in approximately 0-1 day(s).   [name], MD 09/30/2016, 6:38 AM Pager: Pager# [phone]

## 2016-09-30 NOTE — Progress Notes (Signed)
Clinical Social Worker facilitated patient discharge including contacting patient family and facility to confirm patient discharge plans.  Clinical information faxed to facility and family agreeable with plan.  CSW arranged ambulance transport via [facility] to [facility] .  RN to call [phone](pt will go in rm#[phone], ask to talk to 200 hall RN)) for report prior to discharge.  Clinical Social Worker will sign off for now as social work intervention is no longer needed. Please consult [place] again if new need arises.  Marrianne Mood, MSW, [employer] [phone]

## 2016-09-30 NOTE — Progress Notes (Signed)
Given report to Rogelia Boga, LPN at [facility] and answered all her questions.  Waiting for [facility] to pick her up.

## 2016-09-30 NOTE — Progress Notes (Signed)
Printed AVS and sent patient with [facility] to [facility].

## 2016-10-01 LAB — CULTURE, BLOOD (ROUTINE X 2)
Culture: NO GROWTH
Culture: NO GROWTH
SPECIAL REQUESTS: ADEQUATE
SPECIAL REQUESTS: ADEQUATE

## 2016-10-04 LAB — CULTURE, BODY FLUID-BOTTLE

## 2016-10-04 LAB — CULTURE, BODY FLUID W GRAM STAIN -BOTTLE: Culture: NO GROWTH

## 2016-12-20 LAB — ACID FAST SMEAR (AFB, MYCOBACTERIA): Acid Fast Smear: NEGATIVE

## 2016-12-20 LAB — FUNGUS CULTURE RESULT

## 2016-12-20 LAB — FUNGAL ORGANISM REFLEX

## 2016-12-20 LAB — ACID FAST CULTURE WITH REFLEXED SENSITIVITIES: ACID FAST CULTURE - AFSCU3: NEGATIVE

## 2016-12-20 LAB — FUNGUS CULTURE WITH STAIN

## 2016-12-20 LAB — ACID FAST SMEAR (AFB)

## 2017-01-24 ENCOUNTER — Other Ambulatory Visit: Payer: Self-pay

## 2017-01-24 DIAGNOSIS — Z992 Dependence on renal dialysis: Principal | ICD-10-CM

## 2017-01-24 DIAGNOSIS — T82510A Breakdown (mechanical) of surgically created arteriovenous fistula, initial encounter: Secondary | ICD-10-CM

## 2017-01-24 DIAGNOSIS — N186 End stage renal disease: Secondary | ICD-10-CM

## 2017-02-28 ENCOUNTER — Other Ambulatory Visit: Payer: Self-pay | Admitting: *Deleted

## 2017-02-28 ENCOUNTER — Encounter: Payer: Self-pay | Admitting: *Deleted

## 2017-02-28 ENCOUNTER — Ambulatory Visit (HOSPITAL_COMMUNITY)
Admission: RE | Admit: 2017-02-28 | Discharge: 2017-02-28 | Disposition: A | Discharge status: Home / Self Care | Payer: Medicare Other | Source: Ambulatory Visit | Attending: Vascular Surgery | Admitting: Vascular Surgery

## 2017-02-28 ENCOUNTER — Encounter: Payer: Self-pay | Admitting: Vascular Surgery

## 2017-02-28 ENCOUNTER — Ambulatory Visit (INDEPENDENT_AMBULATORY_CARE_PROVIDER_SITE_OTHER): Payer: Medicare Other | Admitting: Vascular Surgery

## 2017-02-28 VITALS — BP 85/52 | HR 56 | Temp 97.1°F | Resp 18 | Ht 63.0 in

## 2017-02-28 DIAGNOSIS — N186 End stage renal disease: Secondary | ICD-10-CM | POA: Diagnosis present

## 2017-02-28 DIAGNOSIS — Z992 Dependence on renal dialysis: Secondary | ICD-10-CM

## 2017-02-28 DIAGNOSIS — T82510A Breakdown (mechanical) of surgically created arteriovenous fistula, initial encounter: Secondary | ICD-10-CM

## 2017-02-28 DIAGNOSIS — X58XXXA Exposure to other specified factors, initial encounter: Secondary | ICD-10-CM | POA: Diagnosis not present

## 2017-02-28 NOTE — Progress Notes (Signed)
Referring Physician: Dr Eliott Nine  Patient name: Bridget Manning MRN: [id number] DOB: May 31, 1945 Sex: female  REASON FOR CONSULT: decreased flow in AV fistula  HPI: Bridget Manning is a 72 y.o. female by Dr. Eliott Nine for evaluation of her left arm AV fistula.  The fistula was placed in [place].  It has never been cannulated.  The patient has also had one prior fistula in the right upper extremity which failed early.  She currently is dialyzing via right-sided catheter.  She dialyzes Monday Wednesday Friday.  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  She states the numbness has been improving some with physical therapy.  She did not really recall when the numbness in her hand started but it did not really sound like it was related to her fistula.  Other medical problems include diabetes prior stroke both of which are currently stable.  Past Medical History:  Diagnosis Date  . Diabetes mellitus without complication (HCC)   . Renal disorder   . Stroke (HCC)   . UTI (urinary tract infection)    History reviewed. No pertinent surgical history.  History reviewed. No pertinent family history.  SOCIAL HISTORY: Social History   Socioeconomic History  . Marital status: Widowed    Spouse name: Not on file  . Number of children: Not on file  . Years of education: Not on file  . Highest education level: Not on file  Social Needs  . Financial resource strain: Not on file  . Food insecurity - worry: Not on file  . Food insecurity - inability: Not on file  . Transportation needs - medical: Not on file  . Transportation needs - non-medical: Not on file  Occupational History  . Not on file  Tobacco Use  . Smoking status: Former Games developer  . Smokeless tobacco: Never Used  Substance and Sexual Activity  . Alcohol use: No  . Drug use: No  . Sexual activity: Not on file  Other Topics Concern  . Not on file  Social History Narrative  . Not on file     Allergies  Allergen Reactions  . 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 Latex Itching and Other (See Comments)    Causes "irritation" at site touched by latex (especially "powdered gloves")    Current Outpatient Medications  Medication Sig Dispense Refill  . albuterol (PROVENTIL) (2.5 MG/3ML) 0.083% nebulizer solution Take 2.5 mg by nebulization every 4 (four) hours as needed for shortness of breath.    Marland Kitchen aspirin 81 MG chewable tablet Chew 81 mg by mouth daily.    Marland Kitchen atorvastatin (LIPITOR) 40 MG tablet Take 40 mg by mouth at bedtime.    . carvedilol (COREG) 3.125 MG tablet Take 3.125 mg by mouth 2 (two) times daily.    . diphenhydrAMINE (BENADRYL) 25 mg capsule Take 25 mg by mouth 3 (three) times daily as needed for itching.    . insulin aspart (NOVOLOG) 100 UNIT/ML injection CBG before meals and at bedtime CBG<70 and symptomatic give IV glucose CBG <70 and asymptomatic give glucose or PO glucose  CBG 70-140, do not give insulin CBG 141-180, give one unit short acting  CBG 191-240, give two units short acting  CBG 241-300, give three units short acting  CBG 301-350, give five units short acting  CBG 351-400 give seven units and contact physician  CBG >  401 contact physician See Discharge summary 10 mL 6  . lisinopril (PRINIVIL,ZESTRIL) 2.5 MG tablet Take 2.5 mg by mouth daily. HOLD IF SYSTOLIC READING IS LESS THAN 161    . loperamide (IMODIUM A-D) 2 MG tablet Take 4 mg by mouth every 6 (six) hours as needed for diarrhea or loose stools.     . multivitamin (RENA-VIT) TABS tablet Take 1 tablet by mouth at bedtime. 30 tablet 2  . ondansetron (ZOFRAN) 4 MG tablet Take 4 mg by mouth every 6 (six) hours as needed for nausea or vomiting.    Marland Kitchen oxyCODONE-acetaminophen (PERCOCET/ROXICET) 5-325 MG tablet Take 1 tablet by mouth 2 (two) times daily as needed for severe pain. 30  tablet 0  . OXYGEN Inhale 2 L into the lungs continuous.    . sevelamer carbonate (RENVELA) 800 MG tablet Take 800 mg by mouth 3 (three) times daily with meals.    . sodium polystyrene (KAYEXALATE) 15 GM/60ML suspension Take 30 g by mouth once as needed (for elevated potassium). CALL MD PRIOR TO USE     No current facility-administered medications for this visit.     ROS:   General:  No weight loss, Fever, chills  HEENT: No recent headaches, no nasal bleeding, no visual changes, no sore throat  Neurologic: No dizziness, blackouts, seizures. No recent symptoms of stroke or mini- stroke. No recent episodes of slurred speech, or temporary blindness.  Cardiac: No recent episodes of chest pain/pressure, no shortness of breath at rest.  +shortness of breath with exertion.  Denies history of atrial fibrillation or irregular heartbeat  Vascular: No history of rest pain in feet.  No history of claudication.  No history of non-healing ulcer, No history of DVT   Pulmonary: No home oxygen, no productive cough, no hemoptysis,  No asthma or wheezing  Musculoskeletal:  [X]  Arthritis, [ ]  Low back pain,  [ ]  Joint pain  Hematologic:No history of hypercoagulable state.  No history of easy bleeding.  No history of anemia  Gastrointestinal: No hematochezia or melena,  No gastroesophageal reflux, no trouble swallowing  Urinary: [X]  chronic Kidney disease, [X]  on HD - [X]  MWF or [ ]  TTHS, [ ]  Burning with urination, [ ]  Frequent urination, [ ]  Difficulty urinating;   Skin: No rashes  Psychological: No history of anxiety,  No history of depression   Physical Examination  Vitals:   02/28/17 0848  BP: (!) 85/52  Pulse: (!) 56  Resp: 18  Temp: (!) 97.1 F (36.2 C)  TempSrc: Oral  SpO2: 95%  Height: 5\' 3"  (1.6 m)    Body mass index is 22.83 kg/m.  General:  Alert and oriented, no acute distress HEENT: Normal Neck: No JVD Pulmonary: Clear to auscultation bilaterally, right side dialysis  catheter Cardiac: Regular Rate and Rhythm  Skin: No rash Extremity Pulses: Absent left radial pulse palpable thrill in fistula which is a radiocephalic fistula based in the distal third of the radial artery left arm.  The fistula does seem to have reasonable inflow with compression.  The fistula is visible on the skin surface throughout half the forearm. Musculoskeletal: Flexion contracture of left hand  Neurologic: Upper and lower extremity motor 3-4/5 and symmetric  DATA:  Patient had a duplex ultrasound of her AV fistula today.  This showed low velocity through the fistula of 60-78 cm/s.  Fistula diameter was 6-7 mm.  Depth was 3 mm.  ASSESSMENT: Patent left arm AV fistula but possible inflow stenosis based on decreased velocities  in the fistula   PLAN: Left arm fistulogram by Dr. Myra GianottiBrabham on March 05, 2017.  Risk benefits possible complications of procedure details were discussed with the patient today.  She understands and agrees to proceed.   Fabienne Brunsharles Felicity Penix, MD [facility] Office: [phone] Pager: [phone]

## 2017-02-28 NOTE — H&P (View-Only) (Signed)
Referring Physician: Dr Eliott Nine  Patient name: Bridget Manning MRN: [id number] DOB: May 31, 1945 Sex: female  REASON FOR CONSULT: decreased flow in AV fistula  HPI: Bridget Manning is a 72 y.o. female by Dr. Eliott Nine for evaluation of her left arm AV fistula.  The fistula was placed in [place].  It has never been cannulated.  The patient has also had one prior fistula in the right upper extremity which failed early.  She currently is dialyzing via right-sided catheter.  She dialyzes Monday Wednesday Friday.  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  She states the numbness has been improving some with physical therapy.  She did not really recall when the numbness in her hand started but it did not really sound like it was related to her fistula.  Other medical problems include diabetes prior stroke both of which are currently stable.  Past Medical History:  Diagnosis Date  . Diabetes mellitus without complication (HCC)   . Renal disorder   . Stroke (HCC)   . UTI (urinary tract infection)    History reviewed. No pertinent surgical history.  History reviewed. No pertinent family history.  SOCIAL HISTORY: Social History   Socioeconomic History  . Marital status: Widowed    Spouse name: Not on file  . Number of children: Not on file  . Years of education: Not on file  . Highest education level: Not on file  Social Needs  . Financial resource strain: Not on file  . Food insecurity - worry: Not on file  . Food insecurity - inability: Not on file  . Transportation needs - medical: Not on file  . Transportation needs - non-medical: Not on file  Occupational History  . Not on file  Tobacco Use  . Smoking status: Former Games developer  . Smokeless tobacco: Never Used  Substance and Sexual Activity  . Alcohol use: No  . Drug use: No  . Sexual activity: Not on file  Other Topics Concern  . Not on file  Social History Narrative  . Not on file     Allergies  Allergen Reactions  . 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 Latex Itching and Other (See Comments)    Causes "irritation" at site touched by latex (especially "powdered gloves")    Current Outpatient Medications  Medication Sig Dispense Refill  . albuterol (PROVENTIL) (2.5 MG/3ML) 0.083% nebulizer solution Take 2.5 mg by nebulization every 4 (four) hours as needed for shortness of breath.    Marland Kitchen aspirin 81 MG chewable tablet Chew 81 mg by mouth daily.    Marland Kitchen atorvastatin (LIPITOR) 40 MG tablet Take 40 mg by mouth at bedtime.    . carvedilol (COREG) 3.125 MG tablet Take 3.125 mg by mouth 2 (two) times daily.    . diphenhydrAMINE (BENADRYL) 25 mg capsule Take 25 mg by mouth 3 (three) times daily as needed for itching.    . insulin aspart (NOVOLOG) 100 UNIT/ML injection CBG before meals and at bedtime CBG<70 and symptomatic give IV glucose CBG <70 and asymptomatic give glucose or PO glucose  CBG 70-140, do not give insulin CBG 141-180, give one unit short acting  CBG 191-240, give two units short acting  CBG 241-300, give three units short acting  CBG 301-350, give five units short acting  CBG 351-400 give seven units and contact physician  CBG >  401 contact physician See Discharge summary 10 mL 6  . lisinopril (PRINIVIL,ZESTRIL) 2.5 MG tablet Take 2.5 mg by mouth daily. HOLD IF SYSTOLIC READING IS LESS THAN 161    . loperamide (IMODIUM A-D) 2 MG tablet Take 4 mg by mouth every 6 (six) hours as needed for diarrhea or loose stools.     . multivitamin (RENA-VIT) TABS tablet Take 1 tablet by mouth at bedtime. 30 tablet 2  . ondansetron (ZOFRAN) 4 MG tablet Take 4 mg by mouth every 6 (six) hours as needed for nausea or vomiting.    Marland Kitchen oxyCODONE-acetaminophen (PERCOCET/ROXICET) 5-325 MG tablet Take 1 tablet by mouth 2 (two) times daily as needed for severe pain. 30  tablet 0  . OXYGEN Inhale 2 L into the lungs continuous.    . sevelamer carbonate (RENVELA) 800 MG tablet Take 800 mg by mouth 3 (three) times daily with meals.    . sodium polystyrene (KAYEXALATE) 15 GM/60ML suspension Take 30 g by mouth once as needed (for elevated potassium). CALL MD PRIOR TO USE     No current facility-administered medications for this visit.     ROS:   General:  No weight loss, Fever, chills  HEENT: No recent headaches, no nasal bleeding, no visual changes, no sore throat  Neurologic: No dizziness, blackouts, seizures. No recent symptoms of stroke or mini- stroke. No recent episodes of slurred speech, or temporary blindness.  Cardiac: No recent episodes of chest pain/pressure, no shortness of breath at rest.  +shortness of breath with exertion.  Denies history of atrial fibrillation or irregular heartbeat  Vascular: No history of rest pain in feet.  No history of claudication.  No history of non-healing ulcer, No history of DVT   Pulmonary: No home oxygen, no productive cough, no hemoptysis,  No asthma or wheezing  Musculoskeletal:  [X]  Arthritis, [ ]  Low back pain,  [ ]  Joint pain  Hematologic:No history of hypercoagulable state.  No history of easy bleeding.  No history of anemia  Gastrointestinal: No hematochezia or melena,  No gastroesophageal reflux, no trouble swallowing  Urinary: [X]  chronic Kidney disease, [X]  on HD - [X]  MWF or [ ]  TTHS, [ ]  Burning with urination, [ ]  Frequent urination, [ ]  Difficulty urinating;   Skin: No rashes  Psychological: No history of anxiety,  No history of depression   Physical Examination  Vitals:   02/28/17 0848  BP: (!) 85/52  Pulse: (!) 56  Resp: 18  Temp: (!) 97.1 F (36.2 C)  TempSrc: Oral  SpO2: 95%  Height: 5\' 3"  (1.6 m)    Body mass index is 22.83 kg/m.  General:  Alert and oriented, no acute distress HEENT: Normal Neck: No JVD Pulmonary: Clear to auscultation bilaterally, right side dialysis  catheter Cardiac: Regular Rate and Rhythm  Skin: No rash Extremity Pulses: Absent left radial pulse palpable thrill in fistula which is a radiocephalic fistula based in the distal third of the radial artery left arm.  The fistula does seem to have reasonable inflow with compression.  The fistula is visible on the skin surface throughout half the forearm. Musculoskeletal: Flexion contracture of left hand  Neurologic: Upper and lower extremity motor 3-4/5 and symmetric  DATA:  Patient had a duplex ultrasound of her AV fistula today.  This showed low velocity through the fistula of 60-78 cm/s.  Fistula diameter was 6-7 mm.  Depth was 3 mm.  ASSESSMENT: Patent left arm AV fistula but possible inflow stenosis based on decreased velocities  in the fistula   PLAN: Left arm fistulogram by Dr. Myra GianottiBrabham on March 05, 2017.  Risk benefits possible complications of procedure details were discussed with the patient today.  She understands and agrees to proceed.   Fabienne Brunsharles Diania Co, MD [facility] Office: [phone] Pager: [phone]

## 2017-03-05 ENCOUNTER — Encounter (HOSPITAL_COMMUNITY)
Admission: RE | Disposition: A | Discharge status: Home / Self Care | Payer: Self-pay | Source: Ambulatory Visit | Attending: Surgery

## 2017-03-05 ENCOUNTER — Ambulatory Visit (HOSPITAL_COMMUNITY)
Admission: RE | Admit: 2017-03-05 | Discharge: 2017-03-05 | Disposition: A | Discharge status: Home / Self Care | Payer: Medicare Other | Source: Ambulatory Visit | Attending: Surgery | Admitting: Surgery

## 2017-03-05 ENCOUNTER — Encounter (HOSPITAL_COMMUNITY): Payer: Self-pay | Admitting: Surgery

## 2017-03-05 DIAGNOSIS — Z7982 Long term (current) use of aspirin: Secondary | ICD-10-CM | POA: Diagnosis not present

## 2017-03-05 DIAGNOSIS — E1122 Type 2 diabetes mellitus with diabetic chronic kidney disease: Secondary | ICD-10-CM | POA: Insufficient documentation

## 2017-03-05 DIAGNOSIS — Z9981 Dependence on supplemental oxygen: Secondary | ICD-10-CM | POA: Diagnosis not present

## 2017-03-05 DIAGNOSIS — Z794 Long term (current) use of insulin: Secondary | ICD-10-CM | POA: Insufficient documentation

## 2017-03-05 DIAGNOSIS — Z992 Dependence on renal dialysis: Secondary | ICD-10-CM | POA: Diagnosis not present

## 2017-03-05 DIAGNOSIS — Z79891 Long term (current) use of opiate analgesic: Secondary | ICD-10-CM | POA: Insufficient documentation

## 2017-03-05 DIAGNOSIS — Z87891 Personal history of nicotine dependence: Secondary | ICD-10-CM | POA: Diagnosis not present

## 2017-03-05 DIAGNOSIS — Z79899 Other long term (current) drug therapy: Secondary | ICD-10-CM | POA: Insufficient documentation

## 2017-03-05 DIAGNOSIS — Y832 Surgical operation with anastomosis, bypass or graft as the cause of abnormal reaction of the patient, or of later complication, without mention of misadventure at the time of the procedure: Secondary | ICD-10-CM | POA: Insufficient documentation

## 2017-03-05 DIAGNOSIS — N186 End stage renal disease: Secondary | ICD-10-CM | POA: Insufficient documentation

## 2017-03-05 DIAGNOSIS — Z8673 Personal history of transient ischemic attack (TIA), and cerebral infarction without residual deficits: Secondary | ICD-10-CM | POA: Insufficient documentation

## 2017-03-05 DIAGNOSIS — M24542 Contracture, left hand: Secondary | ICD-10-CM | POA: Insufficient documentation

## 2017-03-05 DIAGNOSIS — T82590A Other mechanical complication of surgically created arteriovenous fistula, initial encounter: Secondary | ICD-10-CM | POA: Diagnosis not present

## 2017-03-05 DIAGNOSIS — T82898A Other specified complication of vascular prosthetic devices, implants and grafts, initial encounter: Secondary | ICD-10-CM

## 2017-03-05 HISTORY — PX: A/V FISTULAGRAM: CATH118298

## 2017-03-05 LAB — POCT I-STAT, CHEM 8
BUN: 35 mg/dL — AB (ref 6–20)
CREATININE: 4.1 mg/dL — AB (ref 0.44–1.00)
Calcium, Ion: 1.14 mmol/L — ABNORMAL LOW (ref 1.15–1.40)
Chloride: 97 mmol/L — ABNORMAL LOW (ref 101–111)
Glucose, Bld: 93 mg/dL (ref 65–99)
HEMATOCRIT: 38 % (ref 36.0–46.0)
Hemoglobin: 12.9 g/dL (ref 12.0–15.0)
Potassium: 5 mmol/L (ref 3.5–5.1)
Sodium: 135 mmol/L (ref 135–145)
TCO2: 29 mmol/L (ref 22–32)

## 2017-03-05 SURGERY — A/V FISTULAGRAM
Anesthesia: LOCAL | Laterality: Left

## 2017-03-05 MED ORDER — SODIUM CHLORIDE 0.9% FLUSH: 3.0000 mL | INTRAVENOUS | Status: DC | PRN

## 2017-03-05 MED ORDER — HEPARIN (PORCINE) IN NACL 2-0.9 UNIT/ML-% IJ SOLN
INTRAMUSCULAR | Status: AC | PRN
  Administered 2017-03-05: 500 mL

## 2017-03-05 MED ORDER — HYDRALAZINE HCL 20 MG/ML IJ SOLN: 5.0000 mg | INTRAMUSCULAR | Status: DC | PRN

## 2017-03-05 MED ORDER — IODIXANOL 320 MG/ML IV SOLN
INTRAVENOUS | Status: DC | PRN
  Administered 2017-03-05: 30 mL via INTRAVENOUS

## 2017-03-05 MED ORDER — HEPARIN (PORCINE) IN NACL 2-0.9 UNIT/ML-% IJ SOLN
INTRAMUSCULAR | Status: AC
  Filled 2017-03-05: qty 500

## 2017-03-05 MED ORDER — SODIUM CHLORIDE 0.9% FLUSH: 3.0000 mL | Freq: Two times a day (BID) | INTRAVENOUS | Status: DC

## 2017-03-05 MED ORDER — LABETALOL HCL 5 MG/ML IV SOLN: 10.0000 mg | INTRAVENOUS | Status: DC | PRN

## 2017-03-05 MED ORDER — LIDOCAINE HCL (PF) 1 % IJ SOLN
INTRAMUSCULAR | Status: DC | PRN
  Administered 2017-03-05: 2 mL

## 2017-03-05 MED ORDER — SODIUM CHLORIDE 0.9 % IV SOLN
250.0000 mL | INTRAVENOUS | Status: DC | PRN
Start: 2017-03-05 — End: 2017-03-05

## 2017-03-05 MED ORDER — LIDOCAINE HCL 1 % IJ SOLN
INTRAMUSCULAR | Status: AC
  Filled 2017-03-05: qty 20

## 2017-03-05 MED ORDER — SODIUM CHLORIDE 0.9% FLUSH
3.0000 mL | INTRAVENOUS | Status: DC | PRN
Start: 2017-03-05 — End: 2017-03-05

## 2017-03-05 SURGICAL SUPPLY — 10 items
BAG SNAP BAND KOVER 36X36 (MISCELLANEOUS) ×2 IMPLANT
COVER DOME SNAP 22 D (MISCELLANEOUS) ×2 IMPLANT
COVER PRB 48X5XTLSCP FOLD TPE (BAG) ×1 IMPLANT
COVER PROBE 5X48 (BAG) ×1
KIT MICROPUNCTURE NIT STIFF (SHEATH) ×2 IMPLANT
PROTECTION STATION PRESSURIZED (MISCELLANEOUS) ×2
STATION PROTECTION PRESSURIZED (MISCELLANEOUS) ×1 IMPLANT
STOPCOCK MORSE 400PSI 3WAY (MISCELLANEOUS) ×2 IMPLANT
TRAY PV CATH (CUSTOM PROCEDURE TRAY) ×2 IMPLANT
TUBING CIL FLEX 10 FLL-RA (TUBING) ×2 IMPLANT

## 2017-03-05 NOTE — Progress Notes (Signed)
[place] [facility]  Transportation notified and patient in stable condition with no complaints and site of fistulogram unremarkable to be discharged to facility.

## 2017-03-05 NOTE — Op Note (Signed)
    Patient name: Bridget FuseMarion Spiering MRN: [id number] DOB: Feb 19, 1945 Sex: female  03/05/2017 Pre-operative Diagnosis: poorly functioning left AVF Post-operative diagnosis:  Same Surgeon:  Durene CalWells Ganesh Deeg Procedure Performed:  1.  U/s guided access, left arm fistula  2.  fistulogram   Indications:  Patient is here for evaluation of her fistula.  She has low flow rates by duplex  Procedure:  The patient was identified in the holding area and taken to room 8.  The patient was then placed supine on the table and prepped and draped in the usual sterile fashion.  A time out was called.  Ultrasound was used to evaluate the fistula.  The vein was patent and compressible.  A digital ultrasound image was acquired.  The fistula was then accessed under ultrasound guidance using a micropuncture needle.  An 018 wire was then asvanced without resistance and a micropuncture sheath was placed.  Contrast injections were then performed through the sheath.  Findings:  No central stenosis.  Fistula is widely patent.  Arterial anastamosis is widely patent   Intervention:  none  Impression:  #1  Widely patent left arm fistula    V. Durene CalWells Caylin Nass, M.D. [facility] [place] Office: [phone] Pager:  [phone]

## 2017-03-05 NOTE — Discharge Instructions (Signed)

## 2017-03-05 NOTE — Interval H&P Note (Signed)
History and Physical Interval Note:  03/05/2017 8:25 AM  Bridget FuseMarion Manning  has presented today for surgery, with the diagnosis of esrd  The various methods of treatment have been discussed with the patient and family. After consideration of risks, benefits and other options for treatment, the patient has consented to  Procedure(s): A/V FISTULAGRAM (Left) as a surgical intervention .  The patient's history has been reviewed, patient examined, no change in status, stable for surgery.  I have reviewed the patient's chart and labs.  Questions were answered to the patient's satisfaction.     Durene CalWells Eriyanna Kofoed

## 2017-03-14 ENCOUNTER — Emergency Department (HOSPITAL_COMMUNITY): Payer: Medicare Other

## 2017-03-14 ENCOUNTER — Other Ambulatory Visit: Payer: Self-pay

## 2017-03-14 ENCOUNTER — Encounter (HOSPITAL_COMMUNITY): Payer: Self-pay

## 2017-03-14 ENCOUNTER — Emergency Department (HOSPITAL_COMMUNITY)
Admission: EM | Admit: 2017-03-14 | Discharge: 2017-03-15 | Disposition: A | Discharge status: Home / Self Care | Payer: Medicare Other | Attending: Emergency Medicine | Admitting: Emergency Medicine

## 2017-03-14 DIAGNOSIS — J40 Bronchitis, not specified as acute or chronic: Secondary | ICD-10-CM | POA: Insufficient documentation

## 2017-03-14 DIAGNOSIS — Z7982 Long term (current) use of aspirin: Secondary | ICD-10-CM | POA: Insufficient documentation

## 2017-03-14 DIAGNOSIS — R05 Cough: Secondary | ICD-10-CM

## 2017-03-14 DIAGNOSIS — R059 Cough, unspecified: Secondary | ICD-10-CM

## 2017-03-14 DIAGNOSIS — E1122 Type 2 diabetes mellitus with diabetic chronic kidney disease: Secondary | ICD-10-CM | POA: Insufficient documentation

## 2017-03-14 DIAGNOSIS — Z992 Dependence on renal dialysis: Secondary | ICD-10-CM | POA: Insufficient documentation

## 2017-03-14 DIAGNOSIS — Z79899 Other long term (current) drug therapy: Secondary | ICD-10-CM | POA: Insufficient documentation

## 2017-03-14 DIAGNOSIS — Z87891 Personal history of nicotine dependence: Secondary | ICD-10-CM | POA: Diagnosis not present

## 2017-03-14 DIAGNOSIS — Z8673 Personal history of transient ischemic attack (TIA), and cerebral infarction without residual deficits: Secondary | ICD-10-CM | POA: Insufficient documentation

## 2017-03-14 DIAGNOSIS — Z91040 Latex allergy status: Secondary | ICD-10-CM | POA: Insufficient documentation

## 2017-03-14 DIAGNOSIS — N186 End stage renal disease: Secondary | ICD-10-CM | POA: Insufficient documentation

## 2017-03-14 DIAGNOSIS — R4182 Altered mental status, unspecified: Secondary | ICD-10-CM | POA: Diagnosis present

## 2017-03-14 DIAGNOSIS — J90 Pleural effusion, not elsewhere classified: Secondary | ICD-10-CM

## 2017-03-14 LAB — CBC WITH DIFFERENTIAL/PLATELET
Basophils Absolute: 0 10*3/uL (ref 0.0–0.1)
Basophils Relative: 1 %
Eosinophils Absolute: 0.2 10*3/uL (ref 0.0–0.7)
Eosinophils Relative: 4 %
HEMATOCRIT: 33.9 % — AB (ref 36.0–46.0)
Hemoglobin: 10.6 g/dL — ABNORMAL LOW (ref 12.0–15.0)
Lymphocytes Relative: 38 %
Lymphs Abs: 1.9 10*3/uL (ref 0.7–4.0)
MCH: 29.2 pg (ref 26.0–34.0)
MCHC: 31.3 g/dL (ref 30.0–36.0)
MCV: 93.4 fL (ref 78.0–100.0)
MONO ABS: 0.4 10*3/uL (ref 0.1–1.0)
MONOS PCT: 7 %
NEUTROS ABS: 2.6 10*3/uL (ref 1.7–7.7)
Neutrophils Relative %: 50 %
Platelets: 146 10*3/uL — ABNORMAL LOW (ref 150–400)
RBC: 3.63 MIL/uL — ABNORMAL LOW (ref 3.87–5.11)
RDW: 17.1 % — AB (ref 11.5–15.5)
WBC: 5.2 10*3/uL (ref 4.0–10.5)

## 2017-03-14 LAB — BASIC METABOLIC PANEL
Anion gap: 13 (ref 5–15)
BUN: 55 mg/dL — ABNORMAL HIGH (ref 6–20)
CALCIUM: 9 mg/dL (ref 8.9–10.3)
CO2: 24 mmol/L (ref 22–32)
CREATININE: 5.17 mg/dL — AB (ref 0.44–1.00)
Chloride: 103 mmol/L (ref 101–111)
GFR calc Af Amer: 9 mL/min — ABNORMAL LOW (ref >60)
GFR calc non Af Amer: 8 mL/min — ABNORMAL LOW (ref >60)
GLUCOSE: 104 mg/dL — AB (ref 65–99)
Potassium: 5.2 mmol/L — ABNORMAL HIGH (ref 3.5–5.1)
Sodium: 140 mmol/L (ref 135–145)

## 2017-03-14 NOTE — ED Provider Notes (Signed)
[facility] EMERGENCY DEPARTMENT Provider Note   CSN: [id number] Arrival date & time: 03/14/17  1816     History   Chief Complaint Chief Complaint  Patient presents with  . Altered Mental Status    HPI Bridget Manning is a 72 y.o. female.  Patient sent in from [facility].  Supposedly for altered mental status but patient is quite alert very vocal and very helpful with her concerns.  Nursing home stated she had altered mental status last seen normal last night.  But however patient's main concern is been her cough and she does have a strong cough she is worried about having pneumonia.  She is a dialysis patient she tells [place] that she is normally dialyzed Monday Wednesdays and Fridays and she was dialyzed on Wednesday.  And that she has her AV fistula in her left upper arm.  Patient without hypoxia.  No fevers.      Past Medical History:  Diagnosis Date  . Diabetes mellitus without complication (HCC)   . Renal disorder   . Stroke (HCC)   . UTI (urinary tract infection)     Patient Active Problem List   Diagnosis Date Noted  . Hypoglycemia associated with diabetes (HCC) 09/26/2016  . Urinary tract infection 09/26/2016  . Delirium   . Fecal impaction (HCC)   . ESRD (end stage renal disease) on dialysis (HCC) 09/18/2016  . Diabetes mellitus with complication (HCC) 09/18/2016  . History of UTI 09/18/2016  . Hypoglycemia 09/18/2016    Past Surgical History:  Procedure Laterality Date  . A/V FISTULAGRAM Left 03/05/2017   Procedure: A/V FISTULAGRAM;  Surgeon: Nada Libman, MD;  Location: [facility] INVASIVE CV LAB;  Service: Cardiovascular;  Laterality: Left;    OB History    No data available       Home Medications    Prior to Admission medications   Medication Sig Start Date End Date Taking? Authorizing Provider  albuterol (PROVENTIL) (2.5 MG/3ML) 0.083% nebulizer solution Take 2.5 mg by nebulization every 4 (four) hours as needed  for shortness of breath.   Yes [provider]  aspirin 81 MG chewable tablet Chew 81 mg by mouth daily.   Yes [provider]  atorvastatin (LIPITOR) 40 MG tablet Take 40 mg by mouth at bedtime.   Yes [provider]  carvedilol (COREG) 6.25 MG tablet Take 6.25 mg by mouth 2 (two) times daily.    Yes [provider]  guaiFENesin (MUCINEX) 600 MG 12 hr tablet Take 600 mg by mouth 2 (two) times daily.   Yes [provider]  guaifenesin (ROBITUSSIN) 100 MG/5ML syrup Take 300 mg by mouth every 6 (six) hours as needed for cough.   Yes [provider]  hydrOXYzine (ATARAX/VISTARIL) 25 MG tablet Take 12.5 mg by mouth 3 (three) times daily as needed for itching.   Yes [provider]  lisinopril (PRINIVIL,ZESTRIL) 2.5 MG tablet Take 2.5 mg by mouth daily. HOLD IF SYSTOLIC READING IS LESS THAN 130   Yes [provider]  loperamide (IMODIUM A-D) 2 MG tablet Take 4 mg by mouth every 6 (six) hours as needed for diarrhea or loose stools.    Yes [provider]  midodrine (PROAMATINE) 10 MG tablet Take 10 mg by mouth every Monday, Wednesday, and Friday.   Yes [provider]  Multiple Vitamins-Minerals (CERTA-VITE PO) Take 1 tablet by mouth at bedtime.   Yes [provider]  ondansetron (ZOFRAN) 4 MG tablet  Take 4 mg by mouth every 6 (six) hours as needed for nausea or vomiting.   Yes [provider]  oxyCODONE-acetaminophen (PERCOCET/ROXICET) 5-325 MG tablet Take 1 tablet by mouth 2 (two) times daily as needed for severe pain. 09/30/16  Yes [name], MD  OXYGEN Inhale 2 L into the lungs continuous.   Yes [provider]  pregabalin (LYRICA) 50 MG capsule Take 50 mg by mouth 3 (three) times daily.   Yes [provider]  sevelamer carbonate (RENVELA) 800 MG tablet Take 2,400 mg by mouth 3 (three) times daily with meals.    Yes [provider]    Family History History  reviewed. No pertinent family history.  Social History Social History   Tobacco Use  . Smoking status: Former Games developer  . Smokeless tobacco: Never Used  Substance Use Topics  . Alcohol use: No  . Drug use: No     Allergies   Novocain [procaine] and Latex   Review of Systems Review of Systems  Constitutional: Negative for fever.  HENT: Positive for congestion.   Eyes: Negative for visual disturbance.  Respiratory: Positive for cough. Negative for shortness of breath.   Cardiovascular: Negative for chest pain.  Gastrointestinal: Negative for abdominal pain.  Musculoskeletal: Negative for back pain.  Skin: Negative for rash.  Neurological: Positive for weakness. Negative for syncope and headaches.  Hematological: Bruises/bleeds easily.  Psychiatric/Behavioral: Negative for confusion.     Physical Exam Updated Vital Signs BP (!) 147/61 (BP Location: Right Arm)   Pulse 62   Resp 14   Ht 1.6 m (5\' 3" )   Wt 63.5 kg (140 lb)   SpO2 95%   BMI 24.80 kg/m   Physical Exam  Constitutional: She is oriented to person, place, and time. She appears well-developed and well-nourished.  HENT:  Head: Normocephalic and atraumatic.  Mouth/Throat: Oropharynx is clear and moist.  Eyes: Conjunctivae and EOM are normal. Pupils are equal, round, and reactive to light.  Neck: Normal range of motion. Neck supple.  Cardiovascular: Normal rate, regular rhythm and normal heart sounds.  Pulmonary/Chest: Effort normal and breath sounds normal.  Abdominal: Soft. Bowel sounds are normal.  Musculoskeletal:  AV fistula left arm with good thrill.  Neurological: She is alert and oriented to person, place, and time. No cranial nerve deficit.  She with pre-existing weakness to left upper extremity and left lower externally.  Patient does have a fistula with good thrill in the left arm.  Skin: Skin is warm.  Nursing note and vitals reviewed.    ED Treatments / Results  Labs (all labs ordered are  listed, but only abnormal results are displayed) Labs Reviewed  CBC WITH DIFFERENTIAL/PLATELET - Abnormal; Notable for the following components:      Result Value   RBC 3.63 (*)    Hemoglobin 10.6 (*)    HCT 33.9 (*)    RDW 17.1 (*)    Platelets 146 (*)    All other components within normal limits  BASIC METABOLIC PANEL - Abnormal; Notable for the following components:   Potassium 5.2 (*)    Glucose, Bld 104 (*)    BUN 55 (*)    Creatinine, Ser 5.17 (*)    GFR calc non Af Amer 8 (*)    GFR calc Af Amer 9 (*)    All other components within normal limits    EKG  EKG Interpretation None       Radiology Dg Chest 2 View  Result Date:  03/14/2017 CLINICAL DATA:  72 year old female with cough. EXAM: CHEST - 2 VIEW COMPARISON:  Chest radiograph dated 09/29/2016 FINDINGS: Right-sided dialysis catheter with tip at the cavoatrial junction. There is a small right pleural effusion, increased compared to prior radiograph. Right lung base compressive atelectasis versus infiltrate. The left lung is clear. No pneumothorax. There is mild cardiomegaly. Upper mediastinal vascular stent. No acute osseous pathology. IMPRESSION: Small right pleural effusion with right lung base atelectasis versus infiltrate. Clinical correlation is recommended. Overall interval increase in the size of the pleural effusion compared to the prior radiograph. Electronically Signed   By: Elgie CollardArash  Radparvar M.D.   On: 03/14/2017 21:38    Procedures Procedures (including critical care time)  Medications Ordered in ED Medications - No data to display   Initial Impression / Assessment and Plan / ED Course  I have reviewed the triage vital signs and the nursing notes.  Pertinent labs & imaging results that were available during my care of the patient were reviewed by me and considered in my medical decision making (see chart for details).     Patient's main concern is bronchitis or pneumonia.  Chest x-ray raised the  question of a hint of possible infiltrate.  Did not also show any significant pulmonary edema.  Patient's oxygen saturations are very good.  CT chest was ordered to confirm possible pneumonia if she has a pneumonia would be a dialysis patient and nursing home patient she needs to be admitted.  If it is negative can be treated as bronchitis.  Patient is scheduled for dialysis tomorrow.  Final Clinical Impressions(s) / ED Diagnoses   Final diagnoses:  Cough  End stage renal disease on dialysis [facility](HCC)  Bronchitis    ED Discharge Orders    None       Vanetta MuldersZackowski, Maninder Deboer, MD 03/14/17 [id number]

## 2017-03-14 NOTE — ED Triage Notes (Signed)
[facility] from [facility] where staff reports AMS with LSN last night. Staff reports baseline is sitting up in chair talking. Lungs clear, pt has strong cough. Does have dialysis access.

## 2017-03-15 NOTE — ED Provider Notes (Signed)
12:43 AM handoff from Dr. Deretha Emory at shift change.  Patient with history of end-stage renal disease sent for altered mental status however patient is fully oriented and alert during ED stay.  Her chief complaint is cough.  She states that her breathing is at baseline and she is not more short of breath than normal.  She denies any fevers.  Laboratory workup is as expected for a patient with end-stage renal disease on dialysis.  Patient's white blood cell count is 5.2.  Chest CT shows a moderate right-sided pleural effusion.  In the room patient has a rectal temperature of 97.8 F.  She has an oxygen saturation of 100% on 2 L/min.  She is not in any respiratory distress.  Effusion is likely chronic however given her worsening cough, she may benefit from having this drained.  I do not feel that she requires inpatient care for this.  She does not clinically have a pneumonia on exam or suggested by lab work.  I do not feel that she needs antibiotic coverage at this time.  She can continue albuterol, cough medications, and oxygen at home.  Patient is due to receive dialysis tomorrow.  I discussed the importance of discussing the fluid collection with her primary care doctor or with her nephrologist.  Patient verbalizes understanding and agrees with plan.  She is comfortable with this plan.  Results for orders placed or performed during the hospital encounter of 03/14/17  CBC with Differential/Platelet  Result Value Ref Range   WBC 5.2 4.0 - 10.5 K/uL   RBC 3.63 (L) 3.87 - 5.11 MIL/uL   Hemoglobin 10.6 (L) 12.0 - 15.0 g/dL   HCT 16.1 (L) 09.6 - 04.5 %   MCV 93.4 78.0 - 100.0 fL   MCH 29.2 26.0 - 34.0 pg   MCHC 31.3 30.0 - 36.0 g/dL   RDW 40.9 (H) 81.1 - 91.4 %   Platelets 146 (L) 150 - 400 K/uL   Neutrophils Relative % 50 %   Neutro Abs 2.6 1.7 - 7.7 K/uL   Lymphocytes Relative 38 %   Lymphs Abs 1.9 0.7 - 4.0 K/uL   Monocytes Relative 7 %   Monocytes Absolute 0.4 0.1 - 1.0 K/uL    Eosinophils Relative 4 %   Eosinophils Absolute 0.2 0.0 - 0.7 K/uL   Basophils Relative 1 %   Basophils Absolute 0.0 0.0 - 0.1 K/uL  Basic metabolic panel  Result Value Ref Range   Sodium 140 135 - 145 mmol/L   Potassium 5.2 (H) 3.5 - 5.1 mmol/L   Chloride 103 101 - 111 mmol/L   CO2 24 22 - 32 mmol/L   Glucose, Bld 104 (H) 65 - 99 mg/dL   BUN 55 (H) 6 - 20 mg/dL   Creatinine, Ser 7.82 (H) 0.44 - 1.00 mg/dL   Calcium 9.0 8.9 - 95.6 mg/dL   GFR calc non Af Amer 8 (L) >60 mL/min   GFR calc Af Amer 9 (L) >60 mL/min   Anion gap 13 5 - 15   Dg Chest 2 View  Result Date: 03/14/2017 CLINICAL DATA:  72 year old female with cough. EXAM: CHEST - 2 VIEW COMPARISON:  Chest radiograph dated 09/29/2016 FINDINGS: Right-sided dialysis catheter with tip at the cavoatrial junction. There is a small right pleural effusion, increased compared to prior radiograph. Right lung base compressive atelectasis versus infiltrate. The left lung is clear. No pneumothorax. There is mild cardiomegaly. Upper mediastinal vascular stent. No acute osseous pathology. IMPRESSION: Small right pleural effusion  with right lung base atelectasis versus infiltrate. Clinical correlation is recommended. Overall interval increase in the size of the pleural effusion compared to the prior radiograph. Electronically Signed   By: Elgie CollardArash  Radparvar M.D.   On: 03/14/2017 21:38   Ct Chest Wo Contrast  Result Date: 03/15/2017 CLINICAL DATA:  Acute onset of cough.  Dyspnea. EXAM: CT CHEST WITHOUT CONTRAST TECHNIQUE: Multidetector CT imaging of the chest was performed following the standard protocol without IV contrast. COMPARISON:  Chest radiograph performed earlier today at 9:15 p.m. FINDINGS: Cardiovascular: The heart is normal in size. Scattered calcification is seen along the thoracic aorta and proximal great vessels, with a stent noted at the proximal left subclavian artery. Diffuse coronary artery calcifications are seen. Mediastinum/Nodes: The  mediastinum is otherwise grossly unremarkable. No mediastinal lymphadenopathy is seen. No pericardial effusion is identified. Nodularity of the left thyroid lobe may measure up to 2.3 cm in size. No axillary lymphadenopathy is seen. Lungs/Pleura: A moderate right-sided pleural effusion is noted. Trace left-sided pleural fluid is noted. Partial consolidation of the right lower lobe may reflect mild interstitial edema or pneumonia. No pneumothorax is seen. Upper Abdomen: The visualized portions of the liver and spleen are unremarkable. The visualized portions of the pancreas are within normal limits. An apparent 4.0 cm left adrenal adenoma is noted. Nonspecific perinephric stranding is noted bilaterally. Musculoskeletal: No acute osseous abnormalities are identified. There are mild chronic compression deformities of vertebral bodies T6, T7, T12 and L1. The visualized musculature is unremarkable in appearance. IMPRESSION: 1. Moderate right-sided pleural effusion. Trace left-sided pleural fluid. Partial consolidation of the right lower lung lobe may reflect mild interstitial edema or pneumonia. 2. Diffuse coronary artery calcifications. 3. **An incidental finding of potential clinical significance has been found. Nodularity of the left thyroid lobe may measure up to 2.3 cm in size. Consider further evaluation with thyroid ultrasound. If patient is clinically hyperthyroid, consider nuclear medicine thyroid uptake and scan.** 4. 4.0 cm left adrenal adenoma noted. 5. Chronic compression deformities along the thoracic and upper lumbar spine. Electronically Signed   By: Roanna RaiderJeffery  Chang M.D.   On: 03/15/2017 00:02  '   Renne CriglerGeiple, Maanya Hippert, Cordelia Poche-C 03/15/17 Oleta [id number]    Zackowski, Scott, MD 03/15/17 [phone]

## 2017-03-15 NOTE — Discharge Instructions (Signed)
Please read and follow all provided instructions.  Your diagnoses today include:  1. Cough   2. End stage renal disease on dialysis (HCC)   3. Bronchitis   4. Chronic pleural effusion     Tests performed today include:  Blood counts and electrolytes -no sign of severe infection  CT of your chest -shows reaccumulation of fluid in your lungs, no definite pneumonia  Vital signs. See below for your results today.   Medications prescribed:   None  Take any prescribed medications only as directed.  Home care instructions:  Follow any educational materials contained in this packet.  BE VERY CAREFUL not to take multiple medicines containing Tylenol (also called acetaminophen). Doing so can lead to an overdose which can damage your liver and cause liver failure and possibly death.   Follow-up instructions: You will need to discuss the fluid in your lungs with either your kidney doctor or your primary care doctor in the next several days.  You may need this drained again to help your coughing.  Return instructions:   Please return to the Emergency Department if you experience worsening symptoms.   Return with worsening shortness of breath, fevers, pain.  Please return if you have any other emergent concerns.  Additional Information:  Your vital signs today were: BP (!) 153/60 (BP Location: Right Arm)    Pulse 64    Temp 97.8 F (36.6 C) (Rectal)    Resp 11    Ht 5\' 3"  (1.6 m)    Wt 63.5 kg (140 lb)    SpO2 97%    BMI 24.80 kg/m  If your blood pressure (BP) was elevated above 135/85 this visit, please have this repeated by your doctor within one month. --------------

## 2017-03-15 NOTE — ED Notes (Signed)
Pt discharged from ED; instructions provided; Pt encouraged to return to ED if symptoms worsen and to f/u with PCP; Pt verbalized understanding of all instructions 

## 2017-05-20 ENCOUNTER — Emergency Department (HOSPITAL_COMMUNITY)
Admission: EM | Admit: 2017-05-20 | Discharge: 2017-05-20 | Disposition: A | Discharge status: Home / Self Care | Payer: Medicare Other | Attending: Emergency Medicine | Admitting: Emergency Medicine

## 2017-05-20 ENCOUNTER — Encounter (HOSPITAL_COMMUNITY): Payer: Self-pay | Admitting: Emergency Medicine

## 2017-05-20 ENCOUNTER — Emergency Department (HOSPITAL_COMMUNITY): Payer: Medicare Other

## 2017-05-20 DIAGNOSIS — K529 Noninfective gastroenteritis and colitis, unspecified: Secondary | ICD-10-CM | POA: Diagnosis not present

## 2017-05-20 DIAGNOSIS — S0081XA Abrasion of other part of head, initial encounter: Secondary | ICD-10-CM | POA: Insufficient documentation

## 2017-05-20 DIAGNOSIS — E875 Hyperkalemia: Secondary | ICD-10-CM | POA: Insufficient documentation

## 2017-05-20 DIAGNOSIS — R0789 Other chest pain: Secondary | ICD-10-CM

## 2017-05-20 DIAGNOSIS — R531 Weakness: Secondary | ICD-10-CM | POA: Insufficient documentation

## 2017-05-20 DIAGNOSIS — W050XXA Fall from non-moving wheelchair, initial encounter: Secondary | ICD-10-CM | POA: Insufficient documentation

## 2017-05-20 DIAGNOSIS — Z992 Dependence on renal dialysis: Secondary | ICD-10-CM | POA: Diagnosis not present

## 2017-05-20 DIAGNOSIS — R471 Dysarthria and anarthria: Secondary | ICD-10-CM | POA: Insufficient documentation

## 2017-05-20 DIAGNOSIS — J90 Pleural effusion, not elsewhere classified: Secondary | ICD-10-CM | POA: Insufficient documentation

## 2017-05-20 DIAGNOSIS — S0993XA Unspecified injury of face, initial encounter: Secondary | ICD-10-CM | POA: Diagnosis present

## 2017-05-20 DIAGNOSIS — Y92199 Unspecified place in other specified residential institution as the place of occurrence of the external cause: Secondary | ICD-10-CM | POA: Diagnosis not present

## 2017-05-20 DIAGNOSIS — Y999 Unspecified external cause status: Secondary | ICD-10-CM | POA: Diagnosis not present

## 2017-05-20 DIAGNOSIS — Z91040 Latex allergy status: Secondary | ICD-10-CM | POA: Diagnosis not present

## 2017-05-20 DIAGNOSIS — Y939 Activity, unspecified: Secondary | ICD-10-CM | POA: Insufficient documentation

## 2017-05-20 DIAGNOSIS — N186 End stage renal disease: Secondary | ICD-10-CM | POA: Insufficient documentation

## 2017-05-20 DIAGNOSIS — E1122 Type 2 diabetes mellitus with diabetic chronic kidney disease: Secondary | ICD-10-CM | POA: Insufficient documentation

## 2017-05-20 DIAGNOSIS — Z87891 Personal history of nicotine dependence: Secondary | ICD-10-CM | POA: Diagnosis not present

## 2017-05-20 LAB — CBC WITH DIFFERENTIAL/PLATELET
ABS IMMATURE GRANULOCYTES: 0 10*3/uL (ref 0.0–0.1)
BASOS ABS: 0 10*3/uL (ref 0.0–0.1)
Basophils Relative: 0 %
Eosinophils Absolute: 0.2 10*3/uL (ref 0.0–0.7)
Eosinophils Relative: 3 %
HCT: 43.5 % (ref 36.0–46.0)
HEMOGLOBIN: 12.8 g/dL (ref 12.0–15.0)
IMMATURE GRANULOCYTES: 0 %
LYMPHS PCT: 21 %
Lymphs Abs: 1.3 10*3/uL (ref 0.7–4.0)
MCH: 29.1 pg (ref 26.0–34.0)
MCHC: 29.4 g/dL — ABNORMAL LOW (ref 30.0–36.0)
MCV: 98.9 fL (ref 78.0–100.0)
MONO ABS: 0.4 10*3/uL (ref 0.1–1.0)
Monocytes Relative: 7 %
NEUTROS ABS: 4.3 10*3/uL (ref 1.7–7.7)
Neutrophils Relative %: 69 %
Platelets: 167 10*3/uL (ref 150–400)
RBC: 4.4 MIL/uL (ref 3.87–5.11)
RDW: 19.6 % — ABNORMAL HIGH (ref 11.5–15.5)
WBC: 6.2 10*3/uL (ref 4.0–10.5)

## 2017-05-20 LAB — COMPREHENSIVE METABOLIC PANEL
ALBUMIN: 3.4 g/dL — AB (ref 3.5–5.0)
ALT: 16 U/L (ref 14–54)
ANION GAP: 15 (ref 5–15)
AST: 23 U/L (ref 15–41)
Alkaline Phosphatase: 70 U/L (ref 38–126)
BUN: 58 mg/dL — ABNORMAL HIGH (ref 6–20)
CO2: 26 mmol/L (ref 22–32)
Calcium: 9.1 mg/dL (ref 8.9–10.3)
Chloride: 99 mmol/L — ABNORMAL LOW (ref 101–111)
Creatinine, Ser: 7.06 mg/dL — ABNORMAL HIGH (ref 0.44–1.00)
GFR calc Af Amer: 6 mL/min — ABNORMAL LOW (ref >60)
GFR calc non Af Amer: 5 mL/min — ABNORMAL LOW (ref >60)
GLUCOSE: 105 mg/dL — AB (ref 65–99)
Potassium: 5.6 mmol/L — ABNORMAL HIGH (ref 3.5–5.1)
Sodium: 140 mmol/L (ref 135–145)
TOTAL PROTEIN: 6.7 g/dL (ref 6.5–8.1)
Total Bilirubin: 2 mg/dL — ABNORMAL HIGH (ref 0.3–1.2)

## 2017-05-20 LAB — C DIFFICILE QUICK SCREEN W PCR REFLEX
C DIFFICILE (CDIFF) INTERP: NOT DETECTED
C DIFFICLE (CDIFF) ANTIGEN: NEGATIVE
C Diff toxin: NEGATIVE

## 2017-05-20 NOTE — ED Notes (Signed)
RN spoke with [facility] Manning staff stating pt is usually transported by [facility] to her dialysis. Spoke with [employer] and states will call RN back if able to transport patient to dialysis.

## 2017-05-20 NOTE — ED Notes (Signed)
Driver from [facility] at bedside with wheelchair to transport pt to dialysis.

## 2017-05-20 NOTE — ED Provider Notes (Signed)
Emergency Department Provider Note   I have reviewed the triage vital signs and the nursing notes.   HISTORY  Chief Complaint Abnormal CXR   HPI Bridget Manning is a 72 y.o. female with PMH of DM, prior CVA, ESRD on MWF HD presents to the emergency department for evaluation of possible pneumonia on chest x-ray.  The patient apparently had an x-ray performed yesterday which showed improvement in right pleural effusion but question a right basilar infiltrate.  EMS report the patient has had a cough but are unclear about any fever symptoms.  EMS also notes diarrhea over the past several weeks.  Patient states that she fell out of her chair on Friday and hit her head and is also complaining of right lateral chest wall pain.   Leona Singleton and spoke w/ nurse Baird Lyons who reports that the patient fell out of her wheelchair on 5/16.  She fell forward with a very small scratch to the forehead.  No loss of consciousness.  She been complaining of some mid abdomen and chest wall pain so the x-ray was obtained yesterday showing the infiltrate.  No fever, hypoxemia, or other observations.  Given the infiltrate in the patient's end-stage renal disease the facilities nurse practitioner advised to present to the emergency department for evaluation. The facility notes the diarrhea is chronic.    Past Medical History:  Diagnosis Date  . Diabetes mellitus without complication (HCC)   . Renal disorder   . Stroke (HCC)   . UTI (urinary tract infection)     Patient Active Problem List   Diagnosis Date Noted  . Hypoglycemia associated with diabetes (HCC) 09/26/2016  . Urinary tract infection 09/26/2016  . Delirium   . Fecal impaction (HCC)   . ESRD (end stage renal disease) on dialysis (HCC) 09/18/2016  . Diabetes mellitus with complication (HCC) 09/18/2016  . History of UTI 09/18/2016  . Hypoglycemia 09/18/2016    Past Surgical History:  Procedure Laterality Date  . A/V FISTULAGRAM Left  03/05/2017   Procedure: A/V FISTULAGRAM;  Surgeon: Nada Libman, MD;  Location: [facility];  Service: Cardiovascular;  Laterality: Left;    Current Outpatient Rx  . Order #: [id number] Class: Historical Med  . Order #: [id number] Class: Historical Med  . Order #: [id number] Class: Historical Med  . Order #: [id number] Class: Historical Med  . Order #: [id number] Class: Historical Med  . Order #: [id number] Class: Historical Med  . Order #: [id number] Class: Historical Med  . Order #: [id number] Class: Historical Med  . Order #: [id number] Class: Historical Med  . Order #: [id number] Class: Historical Med  . Order #: [id number] Class: Historical Med  . Order #: [id number] Class: Historical Med  . Order #: [id number] Class: Print  . Order #: [id number] Class: Historical Med  . Order #: [id number] Class: Historical Med  . Order #: [id number] Class: Historical Med    Allergies Novocain [procaine] and Latex  No family history on file.  Social History Social History   Tobacco Use  . Smoking status: Former Games developer  . Smokeless tobacco: Never Used  Substance Use Topics  . Alcohol use: No  . Drug use: No    Review of Systems  Constitutional: No fever/chills Eyes: No visual changes. ENT: No sore throat. Cardiovascular: Denies chest pain. Respiratory: Denies shortness of breath. Gastrointestinal: No abdominal pain.  No nausea, no vomiting. Positive chronic diarrhea.  No constipation. Genitourinary: Negative for dysuria. Musculoskeletal: Negative for back pain. Positive right chest wall pain.  Skin: Negative for rash. Neurological: Negative for headaches, focal  weakness or numbness.  10-point ROS otherwise negative.  ____________________________________________   PHYSICAL EXAM:  VITAL SIGNS: Vitals:   05/20/17 1200 05/20/17 1230  BP: (!) 166/63 (!) 181/71  Pulse: 61 64  Resp: 15 16  Temp:    SpO2: 94% 99%    Constitutional: Alert and oriented. Well appearing and in no acute  distress. Eyes: Conjunctivae are normal.  Head: Atraumatic. Nose: No congestion/rhinnorhea. Mouth/Throat: Mucous membranes are moist.  Oropharynx non-erythematous. Neck: No stridor.   Cardiovascular: Normal rate, regular rhythm. Good peripheral circulation. Grossly normal heart sounds.   Respiratory: Normal respiratory effort.  No retractions. Lungs CTAB. Gastrointestinal: Soft and nontender. No distention.  Musculoskeletal: No lower extremity tenderness nor edema. Contracted LUE and LLE. Mild tenderness to palpation of the right anterior and lateral chest wall without crepitus. Neurologic: Slight dysarthria. Contracted LUE with baseline left sided weakness.  Skin:  Skin is warm, dry and intact. No rash noted.  ____________________________________________   LABS (all labs ordered are listed, but only abnormal results are displayed)  Labs Reviewed  COMPREHENSIVE METABOLIC PANEL - Abnormal; Notable for the following components:      Result Value   Potassium 5.6 (*)    Chloride 99 (*)    Glucose, Bld 105 (*)    BUN 58 (*)    Creatinine, Ser 7.06 (*)    Albumin 3.4 (*)    Total Bilirubin 2.0 (*)    GFR calc non Af Amer 5 (*)    GFR calc Af Amer 6 (*)    All other components within normal limits  CBC WITH DIFFERENTIAL/PLATELET - Abnormal; Notable for the following components:   MCHC 29.4 (*)    RDW 19.6 (*)    All other components within normal limits  C DIFFICILE QUICK SCREEN W PCR REFLEX   ____________________________________________  RADIOLOGY  Dg Ribs Unilateral W/chest Right  Result Date: 05/20/2017 CLINICAL DATA:  Right-sided rib pain secondary to a fall. EXAM: RIGHT RIBS AND CHEST - 3+ VIEW COMPARISON:  Chest CT dated 03/14/2017 and chest x-rays dated 03/14/2017 and 09/29/2016 FINDINGS: No fracture or other bone lesions are seen involving the ribs. There is a chronic right pleural effusion. Old mild compression fractures of the superior endplates of T11 and T12 with  more prominent chronic compression fracture of L1. Chronic borderline cardiomegaly.  Aortic atherosclerosis. IMPRESSION: 1. No visible rib fractures. 2. Chronic right pleural effusion. 3. Multiple old vertebral body fractures. 4.  Aortic Atherosclerosis (ICD10-I70.0). Electronically Signed   By: Francene Boyers M.D.   On: 05/20/2017 10:52   Ct Head Wo Contrast  Result Date: 05/20/2017 CLINICAL DATA:  Status post fall with left forehead hematoma. EXAM: CT HEAD WITHOUT CONTRAST TECHNIQUE: Contiguous axial images were obtained from the base of the skull through the vertex without intravenous contrast. COMPARISON:  09/26/2016 FINDINGS: Brain: No evidence of acute infarction, hemorrhage, hydrocephalus, extra-axial collection or mass lesion/mass effect. Several small chronic infarctions within the right parietal, right posterior frontal and right occipital lobe are stable. Moderate brain parenchymal atrophy and chronic deep white matter microangiopathy. Vascular: Calcific atherosclerotic disease in the intra cavernous carotid arteries. Skull: Normal. Negative for fracture or focal lesion. Sinuses/Orbits: No acute finding. Other: Left parietal scalp hematoma. IMPRESSION: No acute intracranial abnormality. Moderate brain parenchymal atrophy and chronic microvascular disease. Stable chronic small cortical infarctions in the right supratentorial brain. Electronically Signed   By: Ted Mcalpine M.D.   On: 05/20/2017 10:31    ____________________________________________   PROCEDURES  Procedure(s) performed:  Procedures  None ____________________________________________   INITIAL IMPRESSION / ASSESSMENT AND PLAN / ED COURSE  Pertinent labs & imaging results that were available during my care of the patient were reviewed by me and considered in my medical decision making (see chart for details).  Presents to the emergency department from a rehab facility with abnormal chest x-ray.  She had a fall 4  days ago from a wheelchair.  X-ray yesterday showed possible infiltrate on the right with pleural effusion.  I reviewed the read of the x-ray but will repeat today.  Patient is well-appearing.  She is afebrile.  She does not require oxygen at this time.  Patient is scheduled to have dialysis today. With significant medical co-morbidity will add on CT head given head trauma, although trauma was relatively minor.   Patient labs, CT head, chest x-ray reviewed with no acute findings.  No concern for pneumonia.  Patient does have hyperkalemia but is scheduled to undergo dialysis today.  I believe the patient can be safely discharged.  We discussed with her nursing facility and they are sending transport and will take the patient directly to her dialysis center.  I spoke with her outpatient dialysis center on the phone to discuss the plan.   At this time, I do not feel there is any life-threatening condition present. I have reviewed and discussed all results (EKG, imaging, lab, urine as appropriate), exam findings with patient. I have reviewed nursing notes and appropriate previous records.  I feel the patient is safe to be discharged home without further emergent workup. Discussed usual and customary return precautions. Patient and family (if present) verbalize understanding and are comfortable with this plan.  Patient will follow-up with their primary care provider. If they do not have a primary care provider, information for follow-up has been provided to them. All questions have been answered.  ____________________________________________  FINAL CLINICAL IMPRESSION(S) / ED DIAGNOSES  Final diagnoses:  Pleural effusion, right  Chest wall pain  Chronic diarrhea  Hyperkalemia    Note:  This document was prepared using Dragon voice recognition software and may include unintentional dictation errors.  Alona Bene, MD Emergency Medicine    Long, Arlyss Repress, MD 05/20/17 [phone]

## 2017-05-20 NOTE — ED Triage Notes (Signed)
Per [facility] pt coming from [facility] and [facility]. Pt had chest xray yesterday stating "Right basilar infiltrate with right pleural effusion. Interval with partial clearing since 06-18-2016." Patient also had fall, unable to tell when fall was.   Staff also informed EMS that pt is due for dialysis today and would like pt to be transported there after.

## 2017-05-20 NOTE — ED Notes (Signed)
[facility] called RN and states unable to take patient due to not having a wheelchair.

## 2017-05-20 NOTE — ED Notes (Signed)
Patient will have wheelchair delivered around 1230 by [facility] and will take patient to dialysis.

## 2017-05-20 NOTE — Discharge Instructions (Signed)
You were seen in the ED today with an abnormal chest x-ray. We do not see a pneumonia on the x-ray today. You should try and get dialysis today. If you are unable to get this, call to re-schedule. Return to the ED with any new or worsening symptoms.

## 2017-05-20 NOTE — ED Notes (Signed)
Patient transported to CT 

## 2017-12-02 ENCOUNTER — Emergency Department (HOSPITAL_COMMUNITY): Payer: Medicare Other

## 2017-12-02 ENCOUNTER — Inpatient Hospital Stay (HOSPITAL_COMMUNITY): Payer: Medicare Other

## 2017-12-02 ENCOUNTER — Encounter (HOSPITAL_COMMUNITY): Payer: Self-pay

## 2017-12-02 ENCOUNTER — Inpatient Hospital Stay (HOSPITAL_COMMUNITY)
Admission: EM | Admit: 2017-12-02 | Discharge: 2017-12-06 | DRG: 193 | Disposition: A | Discharge status: Skilled Nursing Facility | Payer: Medicare Other | Source: Skilled Nursing Facility | Attending: Internal Medicine | Admitting: Internal Medicine

## 2017-12-02 ENCOUNTER — Other Ambulatory Visit: Payer: Self-pay

## 2017-12-02 DIAGNOSIS — Z79891 Long term (current) use of opiate analgesic: Secondary | ICD-10-CM | POA: Diagnosis not present

## 2017-12-02 DIAGNOSIS — Y95 Nosocomial condition: Secondary | ICD-10-CM | POA: Diagnosis present

## 2017-12-02 DIAGNOSIS — J9601 Acute respiratory failure with hypoxia: Secondary | ICD-10-CM | POA: Diagnosis not present

## 2017-12-02 DIAGNOSIS — I9589 Other hypotension: Secondary | ICD-10-CM | POA: Diagnosis present

## 2017-12-02 DIAGNOSIS — Z9981 Dependence on supplemental oxygen: Secondary | ICD-10-CM | POA: Diagnosis not present

## 2017-12-02 DIAGNOSIS — E875 Hyperkalemia: Secondary | ICD-10-CM | POA: Diagnosis present

## 2017-12-02 DIAGNOSIS — Z992 Dependence on renal dialysis: Secondary | ICD-10-CM

## 2017-12-02 DIAGNOSIS — L89312 Pressure ulcer of right buttock, stage 2: Secondary | ICD-10-CM | POA: Diagnosis present

## 2017-12-02 DIAGNOSIS — I2510 Atherosclerotic heart disease of native coronary artery without angina pectoris: Secondary | ICD-10-CM | POA: Diagnosis present

## 2017-12-02 DIAGNOSIS — J9621 Acute and chronic respiratory failure with hypoxia: Secondary | ICD-10-CM | POA: Diagnosis present

## 2017-12-02 DIAGNOSIS — D631 Anemia in chronic kidney disease: Secondary | ICD-10-CM | POA: Diagnosis present

## 2017-12-02 DIAGNOSIS — J90 Pleural effusion, not elsewhere classified: Secondary | ICD-10-CM | POA: Diagnosis present

## 2017-12-02 DIAGNOSIS — F329 Major depressive disorder, single episode, unspecified: Secondary | ICD-10-CM | POA: Diagnosis present

## 2017-12-02 DIAGNOSIS — G9341 Metabolic encephalopathy: Secondary | ICD-10-CM | POA: Diagnosis present

## 2017-12-02 DIAGNOSIS — N250 Renal osteodystrophy: Secondary | ICD-10-CM | POA: Diagnosis present

## 2017-12-02 DIAGNOSIS — R1310 Dysphagia, unspecified: Secondary | ICD-10-CM | POA: Diagnosis present

## 2017-12-02 DIAGNOSIS — N186 End stage renal disease: Secondary | ICD-10-CM | POA: Diagnosis present

## 2017-12-02 DIAGNOSIS — L8990 Pressure ulcer of unspecified site, unspecified stage: Secondary | ICD-10-CM

## 2017-12-02 DIAGNOSIS — Z87891 Personal history of nicotine dependence: Secondary | ICD-10-CM | POA: Diagnosis not present

## 2017-12-02 DIAGNOSIS — R6521 Severe sepsis with septic shock: Secondary | ICD-10-CM

## 2017-12-02 DIAGNOSIS — Z888 Allergy status to other drugs, medicaments and biological substances status: Secondary | ICD-10-CM

## 2017-12-02 DIAGNOSIS — E1122 Type 2 diabetes mellitus with diabetic chronic kidney disease: Secondary | ICD-10-CM | POA: Diagnosis present

## 2017-12-02 DIAGNOSIS — A419 Sepsis, unspecified organism: Secondary | ICD-10-CM | POA: Diagnosis present

## 2017-12-02 DIAGNOSIS — J189 Pneumonia, unspecified organism: Secondary | ICD-10-CM | POA: Diagnosis present

## 2017-12-02 DIAGNOSIS — E440 Moderate protein-calorie malnutrition: Secondary | ICD-10-CM | POA: Diagnosis present

## 2017-12-02 DIAGNOSIS — I120 Hypertensive chronic kidney disease with stage 5 chronic kidney disease or end stage renal disease: Secondary | ICD-10-CM | POA: Diagnosis present

## 2017-12-02 DIAGNOSIS — Z7982 Long term (current) use of aspirin: Secondary | ICD-10-CM | POA: Diagnosis not present

## 2017-12-02 DIAGNOSIS — L89322 Pressure ulcer of left buttock, stage 2: Secondary | ICD-10-CM | POA: Diagnosis present

## 2017-12-02 DIAGNOSIS — E8770 Fluid overload, unspecified: Secondary | ICD-10-CM | POA: Diagnosis present

## 2017-12-02 DIAGNOSIS — Z79899 Other long term (current) drug therapy: Secondary | ICD-10-CM

## 2017-12-02 DIAGNOSIS — Z91040 Latex allergy status: Secondary | ICD-10-CM | POA: Diagnosis not present

## 2017-12-02 DIAGNOSIS — Z8673 Personal history of transient ischemic attack (TIA), and cerebral infarction without residual deficits: Secondary | ICD-10-CM | POA: Diagnosis not present

## 2017-12-02 DIAGNOSIS — Z6825 Body mass index (BMI) 25.0-25.9, adult: Secondary | ICD-10-CM

## 2017-12-02 DIAGNOSIS — Z66 Do not resuscitate: Secondary | ICD-10-CM | POA: Diagnosis present

## 2017-12-02 DIAGNOSIS — D539 Nutritional anemia, unspecified: Secondary | ICD-10-CM | POA: Diagnosis present

## 2017-12-02 LAB — I-STAT CHEM 8, ED
BUN: 69 mg/dL — ABNORMAL HIGH (ref 8–23)
CALCIUM ION: 1.14 mmol/L — AB (ref 1.15–1.40)
CHLORIDE: 104 mmol/L (ref 98–111)
Creatinine, Ser: 7.8 mg/dL — ABNORMAL HIGH (ref 0.44–1.00)
Glucose, Bld: 96 mg/dL (ref 70–99)
HCT: 28 % — ABNORMAL LOW (ref 36.0–46.0)
Hemoglobin: 9.5 g/dL — ABNORMAL LOW (ref 12.0–15.0)
Potassium: 4.5 mmol/L (ref 3.5–5.1)
SODIUM: 142 mmol/L (ref 135–145)
TCO2: 29 mmol/L (ref 22–32)

## 2017-12-02 LAB — CBC WITH DIFFERENTIAL/PLATELET
Abs Immature Granulocytes: 0.09 10*3/uL — ABNORMAL HIGH (ref 0.00–0.07)
Basophils Absolute: 0 10*3/uL (ref 0.0–0.1)
Basophils Relative: 0 %
Eosinophils Absolute: 0 10*3/uL (ref 0.0–0.5)
Eosinophils Relative: 0 %
HCT: 35.2 % — ABNORMAL LOW (ref 36.0–46.0)
Hemoglobin: 9.8 g/dL — ABNORMAL LOW (ref 12.0–15.0)
IMMATURE GRANULOCYTES: 1 %
Lymphocytes Relative: 9 %
Lymphs Abs: 0.9 10*3/uL (ref 0.7–4.0)
MCH: 28.5 pg (ref 26.0–34.0)
MCHC: 27.8 g/dL — AB (ref 30.0–36.0)
MCV: 102.3 fL — AB (ref 80.0–100.0)
MONO ABS: 0.7 10*3/uL (ref 0.1–1.0)
Monocytes Relative: 7 %
NEUTROS ABS: 8.7 10*3/uL — AB (ref 1.7–7.7)
NEUTROS PCT: 83 %
NRBC: 0 % (ref 0.0–0.2)
PLATELETS: 234 10*3/uL (ref 150–400)
RBC: 3.44 MIL/uL — ABNORMAL LOW (ref 3.87–5.11)
RDW: 16.1 % — AB (ref 11.5–15.5)
WBC: 10.5 10*3/uL (ref 4.0–10.5)

## 2017-12-02 LAB — I-STAT VENOUS BLOOD GAS, ED
Acid-Base Excess: 2 mmol/L (ref 0.0–2.0)
Bicarbonate: 27.9 mmol/L (ref 20.0–28.0)
O2 Saturation: 85 %
TCO2: 30 mmol/L (ref 22–32)
pCO2, Ven: 52.4 mmHg (ref 44.0–60.0)
pH, Ven: 7.335 (ref 7.250–7.430)
pO2, Ven: 54 mmHg — ABNORMAL HIGH (ref 32.0–45.0)

## 2017-12-02 LAB — HEMOGLOBIN A1C
Hgb A1c MFr Bld: 5.2 % (ref 4.8–5.6)
Mean Plasma Glucose: 102.54 mg/dL

## 2017-12-02 LAB — COMPREHENSIVE METABOLIC PANEL
ALT: 11 U/L (ref 0–44)
ANION GAP: 17 — AB (ref 5–15)
AST: 19 U/L (ref 15–41)
Albumin: 2.8 g/dL — ABNORMAL LOW (ref 3.5–5.0)
Alkaline Phosphatase: 95 U/L (ref 38–126)
BUN: 68 mg/dL — AB (ref 8–23)
CHLORIDE: 99 mmol/L (ref 98–111)
CO2: 26 mmol/L (ref 22–32)
Calcium: 8.7 mg/dL — ABNORMAL LOW (ref 8.9–10.3)
Creatinine, Ser: 7.6 mg/dL — ABNORMAL HIGH (ref 0.44–1.00)
GFR calc Af Amer: 6 mL/min — ABNORMAL LOW (ref >60)
GFR, EST NON AFRICAN AMERICAN: 5 mL/min — AB (ref >60)
Glucose, Bld: 88 mg/dL (ref 70–99)
POTASSIUM: 4.8 mmol/L (ref 3.5–5.1)
Sodium: 142 mmol/L (ref 135–145)
Total Bilirubin: 1.5 mg/dL — ABNORMAL HIGH (ref 0.3–1.2)
Total Protein: 6.6 g/dL (ref 6.5–8.1)

## 2017-12-02 LAB — IRON AND TIBC
Iron: 25 ug/dL — ABNORMAL LOW (ref 28–170)
Saturation Ratios: 22 % (ref 10.4–31.8)
TIBC: 112 ug/dL — ABNORMAL LOW (ref 250–450)
UIBC: 87 ug/dL

## 2017-12-02 LAB — I-STAT CG4 LACTIC ACID, ED: Lactic Acid, Venous: 0.44 mmol/L — ABNORMAL LOW (ref 0.5–1.9)

## 2017-12-02 LAB — GLUCOSE, CAPILLARY: Glucose-Capillary: 114 mg/dL — ABNORMAL HIGH (ref 70–99)

## 2017-12-02 LAB — FERRITIN: Ferritin: 2496 ng/mL — ABNORMAL HIGH (ref 11–307)

## 2017-12-02 LAB — AMMONIA: Ammonia: 11 umol/L (ref 9–35)

## 2017-12-02 LAB — PROCALCITONIN: PROCALCITONIN: 14.7 ng/mL

## 2017-12-02 MED ORDER — SODIUM CHLORIDE 0.9 % IV BOLUS: 250.0000 mL | Freq: Once | INTRAVENOUS | Status: DC

## 2017-12-02 MED ORDER — THIAMINE HCL 100 MG/ML IJ SOLN
100.0000 mg | Freq: Once | INTRAMUSCULAR | Status: AC
  Administered 2017-12-02: 100 mg via INTRAVENOUS
  Filled 2017-12-02: qty 2

## 2017-12-02 MED ORDER — IPRATROPIUM-ALBUTEROL 0.5-2.5 (3) MG/3ML IN SOLN
3.0000 mL | Freq: Four times a day (QID) | RESPIRATORY_TRACT | Status: DC
  Administered 2017-12-02: 3 mL via RESPIRATORY_TRACT
  Filled 2017-12-02: qty 3

## 2017-12-02 MED ORDER — HEPARIN SODIUM (PORCINE) 5000 UNIT/ML IJ SOLN
5000.0000 [IU] | Freq: Three times a day (TID) | INTRAMUSCULAR | Status: DC
  Administered 2017-12-03 – 2017-12-05 (×9): 5000 [IU] via SUBCUTANEOUS
  Filled 2017-12-02 (×8): qty 1

## 2017-12-02 MED ORDER — INSULIN ASPART 100 UNIT/ML ~~LOC~~ SOLN
0.0000 [IU] | SUBCUTANEOUS | Status: DC
  Administered 2017-12-04: 2 [IU] via SUBCUTANEOUS

## 2017-12-02 MED ORDER — SODIUM CHLORIDE 0.9 % IV SOLN
2.0000 g | Freq: Once | INTRAVENOUS | Status: AC
  Administered 2017-12-02: 2 g via INTRAVENOUS
  Filled 2017-12-02: qty 2

## 2017-12-02 MED ORDER — ATORVASTATIN CALCIUM 40 MG PO TABS
40.0000 mg | ORAL_TABLET | Freq: Every day | ORAL | Status: DC
  Administered 2017-12-05 (×2): 40 mg via ORAL
  Filled 2017-12-02 (×3): qty 1

## 2017-12-02 MED ORDER — MIDODRINE HCL 5 MG PO TABS
10.0000 mg | ORAL_TABLET | ORAL | Status: DC
  Administered 2017-12-04 – 2017-12-06 (×2): 10 mg via ORAL
  Filled 2017-12-02 (×3): qty 2

## 2017-12-02 MED ORDER — VANCOMYCIN HCL IN DEXTROSE 1-5 GM/200ML-% IV SOLN
1000.0000 mg | Freq: Once | INTRAVENOUS | Status: AC
  Administered 2017-12-02: 1000 mg via INTRAVENOUS
  Filled 2017-12-02: qty 200

## 2017-12-02 MED ORDER — VANCOMYCIN VARIABLE DOSE PER UNSTABLE RENAL FUNCTION (PHARMACIST DOSING): Status: DC

## 2017-12-02 MED ORDER — METRONIDAZOLE IN NACL 5-0.79 MG/ML-% IV SOLN
500.0000 mg | Freq: Three times a day (TID) | INTRAVENOUS | Status: DC
  Administered 2017-12-02: 500 mg via INTRAVENOUS
  Filled 2017-12-02: qty 100

## 2017-12-02 MED ORDER — CHLORHEXIDINE GLUCONATE CLOTH 2 % EX PADS
6.0000 | MEDICATED_PAD | Freq: Every day | CUTANEOUS | Status: DC
  Administered 2017-12-04: 6 via TOPICAL

## 2017-12-02 MED ORDER — SEVELAMER CARBONATE 800 MG PO TABS
2400.0000 mg | ORAL_TABLET | Freq: Three times a day (TID) | ORAL | Status: DC
  Administered 2017-12-04 – 2017-12-06 (×7): 2400 mg via ORAL
  Filled 2017-12-02 (×8): qty 3

## 2017-12-02 MED ORDER — LIDOCAINE HCL (PF) 1 % IJ SOLN
INTRAMUSCULAR | Status: AC
  Filled 2017-12-02: qty 5

## 2017-12-02 MED ORDER — SODIUM CHLORIDE 0.9 % IV SOLN
1.0000 g | INTRAVENOUS | Status: DC
  Administered 2017-12-03: 1 g via INTRAVENOUS
  Filled 2017-12-02 (×2): qty 1

## 2017-12-02 MED ORDER — ASPIRIN 81 MG PO CHEW
81.0000 mg | CHEWABLE_TABLET | Freq: Every day | ORAL | Status: DC
  Administered 2017-12-04 – 2017-12-06 (×3): 81 mg via ORAL
  Filled 2017-12-02 (×4): qty 1

## 2017-12-02 MED ORDER — SODIUM CHLORIDE 0.9 % IV BOLUS (SEPSIS): 1000.0000 mL | Freq: Once | INTRAVENOUS | Status: DC

## 2017-12-02 MED ORDER — SODIUM CHLORIDE 0.9 % IV BOLUS (SEPSIS)
1000.0000 mL | Freq: Once | INTRAVENOUS | Status: AC
  Administered 2017-12-02: 1000 mL via INTRAVENOUS

## 2017-12-02 MED ORDER — SERTRALINE HCL 50 MG PO TABS
50.0000 mg | ORAL_TABLET | Freq: Every day | ORAL | Status: DC
  Administered 2017-12-04 – 2017-12-06 (×3): 50 mg via ORAL
  Filled 2017-12-02 (×4): qty 1

## 2017-12-02 MED ORDER — DOXERCALCIFEROL 4 MCG/2ML IV SOLN
1.0000 ug | INTRAVENOUS | Status: DC
  Administered 2017-12-05 – 2017-12-06 (×2): 1 ug via INTRAVENOUS
  Filled 2017-12-02 (×2): qty 2

## 2017-12-02 NOTE — ED Triage Notes (Signed)
Pt bought in by EMS from [facility] due to having AMS. Per facility, pt had fever of 100.3. Pt missed HD treatment today. Pt will communicate with staff. Pt has hx of dementia.

## 2017-12-02 NOTE — ED Notes (Signed)
Pt placed on NRB to maintain Sp02 above 93%. Pt breathes heavily with mouth, therefore [place] was insufficient.
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
## 2017-12-02 NOTE — ED Provider Notes (Addendum)
[facility] EMERGENCY DEPARTMENT Provider Note   CSN: [id number] Arrival date & time: 12/02/17  1142     History   Chief Complaint Chief Complaint  Patient presents with  . Altered Mental Status   Level 5 caveat altered mental status HPI Jayana Kotula is a 72 y.o. female.  HPI History is obtained from [place], Engineer, civil (consulting) at [facility] skilled nursing facility.  Patient has had progressive worsening generalized weakness over the past 3 days, eating less.  She has been depressed.  This morning at 845 she was noted to have temperature of 100.8.  She was treated with Tylenol at 9 AM.  She was too weak to to go to dialysis today so she was sent here.  She had a pulse oximetry of 79% on room air 2 days ago and was started on supplemental oxygen.  Patient normally an uric at baseline patient is alert and talkative Past Medical History:  Diagnosis Date  . Diabetes mellitus without complication (HCC)   . Renal disorder   . Stroke (HCC)   . UTI (urinary tract infection)     Patient Active Problem List   Diagnosis Date Noted  . Hypoglycemia associated with diabetes (HCC) 09/26/2016  . Urinary tract infection 09/26/2016  . Delirium   . Fecal impaction (HCC)   . ESRD (end stage renal disease) on dialysis (HCC) 09/18/2016  . Diabetes mellitus with complication (HCC) 09/18/2016  . History of UTI 09/18/2016  . Hypoglycemia 09/18/2016    Past Surgical History:  Procedure Laterality Date  . A/V FISTULAGRAM Left 03/05/2017   Procedure: A/V FISTULAGRAM;  Surgeon: Nada Libman, MD;  Location: [facility] INVASIVE CV LAB;  Service: Cardiovascular;  Laterality: Left;     OB History   None      Home Medications    Prior to Admission medications   Medication Sig Start Date End Date Taking? Authorizing Provider  albuterol (PROVENTIL) (2.5 MG/3ML) 0.083% nebulizer solution Take 2.5 mg by nebulization every 4 (four) hours as needed for shortness of breath.    [provider]  aspirin 81 MG chewable tablet Chew 81 mg by mouth daily.    [provider]  atorvastatin (LIPITOR) 40 MG tablet Take 40 mg by mouth at bedtime.    [provider]  carvedilol (COREG) 6.25 MG tablet Take 6.25 mg by mouth 2 (two) times daily.     [provider]  guaiFENesin (MUCINEX) 600 MG 12 hr tablet Take 600 mg by mouth 2 (two) times daily.    [provider]  guaifenesin (ROBITUSSIN) 100 MG/5ML syrup Take 300 mg by mouth every 6 (six) hours as needed for cough.    [provider]  hydrOXYzine (ATARAX/VISTARIL) 25 MG tablet Take 12.5 mg by mouth 3 (three) times daily as needed for itching.    [provider]  lisinopril (PRINIVIL,ZESTRIL) 2.5 MG tablet Take 2.5 mg by mouth daily. HOLD IF SYSTOLIC READING IS LESS THAN 409    [provider]  loperamide (IMODIUM A-D) 2 MG tablet Take 4 mg by mouth every 6 (six) hours as needed for diarrhea or loose stools.     [provider]  midodrine (PROAMATINE) 10 MG tablet Take 10 mg by mouth every Monday, Wednesday, and Friday.    [provider]  Multiple Vitamins-Minerals (CERTA-VITE PO) Take 1 tablet by mouth at bedtime.    [provider]  ondansetron (ZOFRAN) 4 MG tablet Take 4 mg by mouth every 6 (six)  hours as needed for nausea or vomiting.    [provider]  oxyCODONE-acetaminophen (PERCOCET/ROXICET) 5-325 MG tablet Take 1 tablet by mouth 2 (two) times daily as needed for severe pain. 09/30/16   [name], MD  OXYGEN Inhale 2 L into the lungs continuous.    [provider]  pregabalin (LYRICA) 50 MG capsule Take 50 mg by mouth 3 (three) times daily.    [provider]  sevelamer carbonate (RENVELA) 800 MG tablet Take 2,400 mg by mouth 3 (three) times daily with meals.     [provider]    Family History No family history on file.  Social History Social History   Tobacco Use  . Smoking status: Former  Games developer  . Smokeless tobacco: Never Used  Substance Use Topics  . Alcohol use: No  . Drug use: No     Allergies   Novocain [procaine] and Latex   Review of Systems Review of Systems  Genitourinary:       Anuric  Allergic/Immunologic: Positive for immunocompromised state.       Hemodialysis patient  Psychiatric/Behavioral: Positive for dysphoric mood.     Physical Exam Updated Vital Signs BP (!) 92/46   Pulse (!) 54   Temp 98.4 F (36.9 C) (Rectal)   Resp 20   SpO2 100%   Physical Exam  Constitutional:  Chronically ill appearing  HENT:  Head: Normocephalic and atraumatic.  No facial asymmetry  Eyes: Pupils are equal, round, and reactive to light. EOM are normal.  Neck: Neck supple. No tracheal deviation present. No thyromegaly present.  Cardiovascular: Normal rate and regular rhythm.  No murmur heard. Pulmonary/Chest:  Diffuse rhonchi  Abdominal: Soft. Bowel sounds are normal. She exhibits no distension. There is no tenderness.  Musculoskeletal: Normal range of motion. She exhibits no edema or tenderness.  There are atrophy of both lower extremities.  Flexion contracture of left hand dialysis fistula left upper extremity with good thrill  Neurological: Coordination normal.  Sleepy arousable with gentle tactile stimulus.  Moves bilateral upper extremities.  Skin: Skin is warm and dry. No rash noted.   noDecubitus ulcers no rash  Psychiatric:  Obtainable altered mental status  Nursing note and vitals reviewed.    ED Treatments / Results  Labs (all labs ordered are listed, but only abnormal results are displayed) Labs Reviewed  COMPREHENSIVE METABOLIC PANEL  CBC WITH DIFFERENTIAL/PLATELET  AMMONIA  BLOOD GAS, VENOUS  I-STAT TROPONIN, ED    EKG EKG Interpretation  Date/Time:  Monday December 02 2017 13:16:25 EST Ventricular Rate:  61 PR Interval:    QRS Duration: 104 QT Interval:  427 QTC Calculation: 431 R Axis:   24 Text Interpretation:  Sinus  rhythm Nonspecific T abnormalities, lateral leads No significant change since last tracing Confirmed by Doug Sou [phone]) on 12/02/2017 1:21:48 PM   Radiology No results found.  Procedures Procedures (including critical care time)  Medications Ordered in ED Medications  lidocaine (PF) (XYLOCAINE) 1 % injection (has no administration in time range)   Code sepsis ordered by me.  Based on sirs criteria of blood pressure, temperature.  Source of infection unknown.  30 mL/kg normal saline bolus ordered due to low mean arterial pressure. Nursing unable to establish peripheral IV access.  Angiocath insertion Performed by: Doug Sou  Consent: Verbal consent obtained. Risks and benefits: risks, benefits and alternatives were discussed Time out: Immediately prior to procedure a "time out" was called to verify the correct patient, procedure, equipment, support staff  and site/side marked as required.  Preparation: Patient was prepped and draped in the usual sterile fashion.  Vein Location: Left external jugular    Gauge: 20  Normal blood return and flush without difficulty Patient tolerance: Patient tolerated the procedure well with no immediate complications.  4:30 PM patient much more alert talkative after treatment with intravenous antibiotics and intravenous fluid bolus.  Blood pressure 110/48.  Mean arterial pressure 69.  I have consulted hospitalist service to arrange for admission  Sepsis - Repeat Assessment  Performed at:    430  Vitals     Blood pressure (!) 110/48, pulse 63, temperature (!) 97.5 F (36.4 C), temperature source Oral, resp. rate 18, SpO2 (!) 88 %.  Heart:     Regular rate and rhythm  Lungs:    Rhonchi  Capillary Refill:   <2 sec  Peripheral Pulse:   Radial pulse palpable  Skin:     Normal Color Results for orders placed or performed during the hospital encounter of 12/02/17  Comprehensive metabolic panel  Result Value Ref Range   Sodium  142 135 - 145 mmol/L   Potassium 4.8 3.5 - 5.1 mmol/L   Chloride 99 98 - 111 mmol/L   CO2 26 22 - 32 mmol/L   Glucose, Bld 88 70 - 99 mg/dL   BUN 68 (H) 8 - 23 mg/dL   Creatinine, Ser 2.957.60 (H) 0.44 - 1.00 mg/dL   Calcium 8.7 (L) 8.9 - 10.3 mg/dL   Total Protein 6.6 6.5 - 8.1 g/dL   Albumin 2.8 (L) 3.5 - 5.0 g/dL   AST 19 15 - 41 U/L   ALT 11 0 - 44 U/L   Alkaline Phosphatase 95 38 - 126 U/L   Total Bilirubin 1.5 (H) 0.3 - 1.2 mg/dL   GFR calc non Af Amer 5 (L) >60 mL/min   GFR calc Af Amer 6 (L) >60 mL/min   Anion gap 17 (H) 5 - 15  CBC with Differential/Platelet  Result Value Ref Range   WBC 10.5 4.0 - 10.5 K/uL   RBC 3.44 (L) 3.87 - 5.11 MIL/uL   Hemoglobin 9.8 (L) 12.0 - 15.0 g/dL   HCT 28.435.2 (L) 13.236.0 - 44.046.0 %   MCV 102.3 (H) 80.0 - 100.0 fL   MCH 28.5 26.0 - 34.0 pg   MCHC 27.8 (L) 30.0 - 36.0 g/dL   RDW 10.216.1 (H) 72.511.5 - 36.615.5 %   Platelets 234 150 - 400 K/uL   nRBC 0.0 0.0 - 0.2 %   Neutrophils Relative % 83 %   Neutro Abs 8.7 (H) 1.7 - 7.7 K/uL   Lymphocytes Relative 9 %   Lymphs Abs 0.9 0.7 - 4.0 K/uL   Monocytes Relative 7 %   Monocytes Absolute 0.7 0.1 - 1.0 K/uL   Eosinophils Relative 0 %   Eosinophils Absolute 0.0 0.0 - 0.5 K/uL   Basophils Relative 0 %   Basophils Absolute 0.0 0.0 - 0.1 K/uL   Immature Granulocytes 1 %   Abs Immature Granulocytes 0.09 (H) 0.00 - 0.07 K/uL  Ammonia  Result Value Ref Range   Ammonia 11 9 - 35 umol/L  I-Stat CG4 Lactic Acid, ED  Result Value Ref Range   Lactic Acid, Venous 0.44 (L) 0.5 - 1.9 mmol/L  I-Stat venous blood gas, ED  Result Value Ref Range   pH, Ven 7.335 7.250 - 7.430   pCO2, Ven 52.4 44.0 - 60.0 mmHg   pO2, Ven 54.0 (H) 32.0 - 45.0 mmHg  Bicarbonate 27.9 20.0 - 28.0 mmol/L   TCO2 30 22 - 32 mmol/L   O2 Saturation 85.0 %   Acid-Base Excess 2.0 0.0 - 2.0 mmol/L   Patient temperature HIDE    Sample type VENOUS   I-stat chem 8, ed  Result Value Ref Range   Sodium 142 135 - 145 mmol/L   Potassium 4.5 3.5 -  5.1 mmol/L   Chloride 104 98 - 111 mmol/L   BUN 69 (H) 8 - 23 mg/dL   Creatinine, Ser 5.28 (H) 0.44 - 1.00 mg/dL   Glucose, Bld 96 70 - 99 mg/dL   Calcium, Ion 4.13 (L) 1.15 - 1.40 mmol/L   TCO2 29 22 - 32 mmol/L   Hemoglobin 9.5 (L) 12.0 - 15.0 g/dL   HCT 24.4 (L) 01.0 - 27.2 %   Dg Chest Port 1 View  Result Date: 12/02/2017 CLINICAL DATA:  Acute mental status change.  Fever. EXAM: PORTABLE CHEST 1 VIEW COMPARISON:  May 22, 2017 FINDINGS: The right hemithorax is now almost completely opacified. Visualized cardiomediastinal silhouette and left lung are normal. A left-sided vascular stent is again identified. IMPRESSION: Near complete opacification of the right hemithorax likely represents fluid and underlying opacity. The finding is indeterminate. A CT scan could further evaluate. Electronically Signed   By: Gerome Zerline Melchior III M.D   On: 12/02/2017 13:49    Initial Impression / Assessment and Plan / ED Course  I have reviewed the triage vital signs and the nursing notes.  Pertinent labs & imaging results that were available during my care of the patient were reviewed by me and considered in my medical decision making (see chart for details).   Patient is full CODE STATUS  Chest x-ray viewed by me.  CT scan of chest ordered,, pending. Lab work remarkable for end-stage renal disease and gross macrocytic anemia. pulse oximetry consistent with hypoxia Final Clinical Impressions(s) / ED Diagnoses  Diagnosis #1 undifferentiated sepsis with septic shock #2 anemia #3 hypoxia #4 abnormal chest x-ray #5 end-stage renal disease Final diagnoses:  None  CRITICAL CARE Performed by: Doug Sou Total critical care time: 40 minutes Critical care time was exclusive of separately billable procedures and treating other patients. Critical care was necessary to treat or prevent imminent or life-threatening deterioration. Critical care was time spent personally by me on the following activities:  development of treatment plan with patient and/or surrogate as well as nursing, discussions with consultants, evaluation of patient's response to treatment, examination of patient, obtaining history from patient or surrogate, ordering and performing treatments and interventions, ordering and review of laboratory studies, ordering and review of radiographic studies, pulse oximetry and re-evaluation of patient's condition.  ED Discharge Orders    None       Doug Sou, MD 12/02/17 Dalbert Garnet, MD 12/02/17 [phone]

## 2017-12-02 NOTE — ED Notes (Addendum)
Pt having runs of VT; admitting team paged.

## 2017-12-02 NOTE — ED Notes (Signed)
ED Provider at bedside. 

## 2017-12-02 NOTE — ED Notes (Signed)
Patient transported to CT 

## 2017-12-02 NOTE — H&P (Addendum)
History and Physical  Bridget FuseMarion Manning BJY:[id number] DOB: 04/28/1945 DOA: 12/02/2017  Referring physician: Dr Ethelda ChickJacubowitz  PCP: Rogers Seedsarr, Sandra, MD  Outpatient Specialists: None Patient coming from: SNF Chief Complaint: Generalized weakness and a fever from skilled nursing facility  HPI: Bridget FuseMarion Fuhrman is a 72 y.o. female with past medical history significant for former tobacco user, end-stage renal disease on dialysis Monday Wednesday Friday, type 2 diabetes, chronic hypotension on midodrine, who [facility]F after being found with worsening weakness and a fever of 100.8.  Patient is currently confused and unable to provide a history.  Most of the history is obtained from ED provider and medical records.  Per her nurse at SNF patient was too weak today to go to dialysis.  Temperature at SNF was 100.8.  Was sent to the ED due to concern for infection.  Patient does not have any children.  Her next of kin is her sister who lives in [place].  Per her sister her wishes are to be DNR and would not want to be resuscitated.  ED Course: Upon presentation to the ED, patient was hypoxic with O2 saturation 79% on room air.  She was promptly placed on nasal cannula 5 L with O2 saturation correcting to 90%.  Hypotensive.  Lab studies remarkable for drop in hemoglobin from 12.8 baseline to 9.5.  Chest x-ray done in the ED revealed complete blackout of the right lung.  CT chest has been ordered and is pending.  Review of Systems: Review of systems as noted in the HPI. All other systems reviewed and are negative.   Past Medical History:  Diagnosis Date  . Diabetes mellitus without complication (HCC)   . Renal disorder   . Stroke (HCC)   . UTI (urinary tract infection)    Past Surgical History:  Procedure Laterality Date  . A/V FISTULAGRAM Left 03/05/2017   Procedure: A/V FISTULAGRAM;  Surgeon: Nada LibmanBrabham, Vance W, MD;  Location: [facility] INVASIVE CV LAB;  Service: Cardiovascular;  Laterality: Left;     Social History:  reports that she has quit smoking. She has never used smokeless tobacco. She reports that she does not drink alcohol or use drugs.   Allergies  Allergen Reactions  . 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 Latex Itching and Other (See Comments)    Causes "irritation" at site touched by latex (especially "powdered gloves")    No family history on file.  Mother deceased 2 years ago with heart disease.  Prior to Admission medications   Medication Sig Start Date End Date Taking? Authorizing Provider  albuterol (PROVENTIL) (2.5 MG/3ML) 0.083% nebulizer solution Take 2.5 mg by nebulization every 4 (four) hours as needed for shortness of breath.   Yes [provider]  aspirin 81 MG chewable tablet Chew 81 mg by mouth daily.   Yes [provider]  atorvastatin (LIPITOR) 40 MG tablet Take 40 mg by mouth at bedtime.   Yes [provider]  carvedilol (COREG) 6.25 MG tablet Take 6.25 mg by mouth 2 (two) times daily.    Yes [provider]  cetirizine (ZYRTEC) 10 MG tablet Take 10 mg by mouth daily.   Yes [provider]  guaiFENesin (MUCINEX) 600 MG 12 hr tablet Take 600 mg by mouth 2 (two) times daily.   Yes [provider]  loperamide (IMODIUM A-D) 2 MG tablet Take 4 mg  by mouth every 6 (six) hours as needed for diarrhea or loose stools.    Yes [provider]  midodrine (PROAMATINE) 10 MG tablet Take 10 mg by mouth every Monday, Wednesday, and Friday. On dialysis days   Yes [provider]  Multiple Vitamins-Minerals (CERTA-VITE PO) Take 1 tablet by mouth at bedtime.   Yes [provider]  OXYGEN Inhale 2 L into the lungs continuous.   Yes [provider]  pregabalin (LYRICA) 50 MG capsule Take 50 mg by mouth 2 (two) times daily.    Yes [provider]  sertraline  (ZOLOFT) 50 MG tablet Take 50 mg by mouth daily. 11/30/17  Yes [provider]  sevelamer carbonate (RENVELA) 800 MG tablet Take 2,400 mg by mouth 3 (three) times daily with meals.    Yes [provider]  oxyCODONE-acetaminophen (PERCOCET/ROXICET) 5-325 MG tablet Take 1 tablet by mouth 2 (two) times daily as needed for severe pain. Patient not taking: Reported on 12/02/2017 09/30/16   [name], MD    Physical Exam: BP (!) 110/48 (BP Location: Right Arm)   Pulse 63   Temp (!) 97.5 F (36.4 C) (Oral)   Resp 18   SpO2 (!) 88%   . General: 72 y.o. year-old female well developed well nourished appears uncomfortable due to dyspnea. Alert but confused.  Very dry oral mucous membrane. . Cardiovascular: Regular rate and rhythm with no rubs or gallops.  No thyromegaly or JVD noted.  No lower extremity edema. 2/4 pulses in all 4 extremities. Marland Kitchen Respiratory: Diffuse rales bilaterally.  No wheezes noted.  Poor inspiratory effort..  . Abdomen: Soft nontender nondistended with normal bowel sounds x4 quadrants. . Muskuloskeletal: No cyanosis, clubbing or edema noted bilaterally . Neuro: CN II-XII intact, strength, sensation, reflexes . Skin: No ulcerative lesions noted or rashes . Psychiatry: Judgement and insight appear impaired. Mood is appropriate for condition and setting          Labs on Admission:  Basic Metabolic Panel: Recent Labs  Lab 12/02/17 1307 12/02/17 1315  NA 142 142  K 4.8 4.5  CL 99 104  CO2 26  --   GLUCOSE 88 96  BUN 68* 69*  CREATININE 7.60* 7.80*  CALCIUM 8.7*  --    Liver Function Tests: Recent Labs  Lab 12/02/17 1307  AST 19  ALT 11  ALKPHOS 95  BILITOT 1.5*  PROT 6.6  ALBUMIN 2.8*   No results for input(s): LIPASE, AMYLASE in the last 168 hours. Recent Labs  Lab 12/02/17 1307  AMMONIA 11   CBC: Recent Labs  Lab 12/02/17 1307 12/02/17 1315  WBC 10.5  --   NEUTROABS 8.7*  --   HGB 9.8* 9.5*  HCT 35.2* 28.0*  MCV 102.3*   --   PLT 234  --    Cardiac Enzymes: No results for input(s): CKTOTAL, CKMB, CKMBINDEX, TROPONINI in the last 168 hours.  BNP (last 3 results) No results for input(s): BNP in the last 8760 hours.  ProBNP (last 3 results) No results for input(s): PROBNP in the last 8760 hours.  CBG: No results for input(s): GLUCAP in the last 168 hours.  Radiological Exams on Admission: Dg Chest Port 1 View  Result Date: 12/02/2017 CLINICAL DATA:  Acute mental status change.  Fever. EXAM: PORTABLE CHEST 1 VIEW COMPARISON:  May 22, 2017 FINDINGS: The right hemithorax is now almost completely opacified. Visualized cardiomediastinal silhouette and left lung are normal. A left-sided vascular stent is again identified. IMPRESSION:  Near complete opacification of the right hemithorax likely represents fluid and underlying opacity. The finding is indeterminate. A CT scan could further evaluate. Electronically Signed   By: Gerome Sam III M.D   On: 12/02/2017 13:49    EKG: I independently viewed the EKG done and my findings are as followed: Sinus rhythm with rate of 61 with no specific ST-T changes.  Assessment/Plan Present on Admission: . Acute respiratory failure with hypoxia (HCC)  Active Problems:   Acute respiratory failure with hypoxia (HCC)   Acute respiratory failure with hypoxia secondary to large right pleural effusion versus atelectasis from mucous plugging versus HCAP versus aspiration pneumonitis Reviewed chest x-ray done on admission which revealed complete blackout of right lung.  Differentials include atelectasis from mucous plugging versus large pleural effusion versus others Obtain CT chest to further assess Continue O2 supplementation to maintain O2 saturation greater than 92% ABG done in the ED revealed significant hypoxia Continuous O2 monitoring DuoNebs every 6 hours and every 2 hours as needed Rule out aspiration Speech therapy to evaluate Keep n.p.o. until passes a swallow  evaluation Start IV antibiotics empirically Obtain procalcitonin Monitor fever curve and WBC  Suspected dysphagia Speech therapy consult  Acute metabolic encephalopathy suspect secondary to hypoxia versus possible infective process versus others Per her sister, at her baseline she is more alert with no prior history of dementia that she knows of Reorient as needed Start IV antibiotics empirically Obtain procalcitonin Monitor fever curve and WBC Repeat CBC in the morning  Chronic hypotension Resume midodrine Monitor vital signs Maintain map above 65 if possible  End-stage renal disease on hemodialysis Monday Wednesday Friday Resume hemodialysis Missed her appointment today due to generalized weakness  Physical debility/generalized weakness PT to assess Oral supplementation when able to pass a swallow evaluation Monitor electrolytes Monitor H&H Fall precautions  Anemia of chronic disease secondary to end-stage renal disease Hemoglobin dropped from baseline of 12 to 9.5 Obtain iron studies Defer to nephrology to give iron supplement or RBC stimulating agents Repeat CBC in the morning  Type 2 diabetes Obtain A1c Insulin sliding scale every 4 hours as needed for hyperglycemia CBGs every 4 hours Avoid hypoglycemia  Risk: High risk for decompensation due to acute hypoxic respiratory failure requiring high level of oxygen in the setting of suspected atelectasis versus large pleural effusion, end-stage renal disease on dialysis, multiple comorbidities, and advanced age.  DVT prophylaxis: Subcu heparin 3 times daily  Code Status: DNR  Family Communication: Spoke to sister who is her next of kin.  She lives in [place].  She stated the patient has always wanted to be DNR.  No resuscitation.  Disposition Plan: Admit to telemetry unit  Consults called: None  Admission status: Inpatient status    Darlin Drop MD [facility] Pager [phone]  If 7PM-7AM,  please contact night-coverage [email or web address] Password [email or web address]  12/02/2017, 5:50 PM

## 2017-12-02 NOTE — Progress Notes (Signed)
Pharmacy Antibiotic Note  Bridget Manning is a 72 y.o. female admitted on 12/02/2017 with sepsis.  Pharmacy has been consulted for vancomycin and cefepime dosing.  Given 1 time doses in the ED. ESRD, missed HD today. Weighed 64 kg earlier this year.  Plan: Start cefepime 1g IV Q24h until HD schedule decided Start vancomycin 750mg  IV QHD, follow up HD schedule for further doses Monitor clinical picture, renal function, VT prn F/U C&S, abx deescalation / LOT    Temp (24hrs), Avg:98.4 F (36.9 C), Min:98.4 F (36.9 C), Max:98.4 F (36.9 C)  No results for input(s): WBC, CREATININE, LATICACIDVEN, VANCOTROUGH, VANCOPEAK, VANCORANDOM, GENTTROUGH, GENTPEAK, GENTRANDOM, TOBRATROUGH, TOBRAPEAK, TOBRARND, AMIKACINPEAK, AMIKACINTROU, AMIKACIN in the last 168 hours.  CrCl cannot be calculated (Patient's most recent lab result is older than the maximum 21 days allowed.).    Allergies  Allergen Reactions  . 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 Latex Itching and Other (See Comments)    Causes "irritation" at site touched by latex (especially "powdered gloves")    Thank you for allowing pharmacy to be a part of this patient's care.  Armandina StammerBATCHELDER,Adalena Abdulla J 12/02/2017 1:02 PM

## 2017-12-02 NOTE — ED Notes (Signed)
Admitting MD notified of continued episodes of VT. Md verifying code status. RN to notify MD of further dysrhythmias.

## 2017-12-02 NOTE — ED Notes (Signed)
ED TO INPATIENT HANDOFF REPORT  Name/Age/Gender Bridget Manning 72 y.o. female  Code Status    Code Status Orders  (From admission, onward)         Start     Ordered   12/02/17 1748  Do not attempt resuscitation (DNR)  Continuous    Question Answer Comment  In the event of cardiac or respiratory ARREST Do not call a "code blue"   In the event of cardiac or respiratory ARREST Do not perform Intubation, CPR, defibrillation or ACLS   In the event of cardiac or respiratory ARREST Use medication by any route, position, wound care, and other measures to relive pain and suffering. May use oxygen, suction and manual treatment of airway obstruction as needed for comfort.      12/02/17 1748        Code Status History    Date Active Date Inactive Code Status Order ID Comments User Context   03/05/2017 1005 03/05/2017 1528 Full Code [id number]  Nada LibmanBrabham, Vance W, MD Inpatient   09/26/2016 1539 09/30/2016 1644 Full Code [id number]  Eulah PontBlum, Nina, MD Inpatient   09/19/2016 0149 09/20/2016 1843 Full Code [id number]  Arnetha CourserAmin, Sumayya, MD Inpatient      Home/SNF/Other Nursing Home  Chief Complaint AMS  Level of Care/Admitting Diagnosis ED Disposition    ED Disposition Condition Comment   Admit  [facility] [[id number]]  Level of Care: Telemetry [5]  Diagnosis: Acute respiratory failure with hypoxia [facility](HCC) [[id number]][[id number]]  Admitting Physician: Darlin DropHALL, CAROLE N [[id number]]  Attending Physician: Darlin DropHALL, CAROLE N [[id number]]  Estimated length of stay: past midnight tomorrow  Certification:: I certify this patient will need inpatient services for at least 2 midnights  PT Class (Do Not Modify): Inpatient [101]  PT Acc Code (Do Not Modify): Private [1]       Medical History Past Medical History:  Diagnosis Date  . Diabetes mellitus without complication (HCC)   . Renal disorder   . Stroke (HCC)   . UTI (urinary tract infection)     Allergies Allergies  Allergen Reactions  .  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 Latex Itching and Other (See Comments)    Causes "irritation" at site touched by latex (especially "powdered gloves")    IV Location/Drains/Wounds Patient Lines/Drains/Airways Status   Active Line/Drains/Airways    Name:   Placement date:   Placement time:   Site:   Days:   Peripheral IV 12/02/17 Right;Anterior Forearm   12/02/17    1319    Forearm   less than 1   Peripheral IV 12/02/17 Left External jugular   12/02/17    1300    External jugular   less than 1   Hemodialysis Catheter Right Double-lumen   -    -    -      External Urinary Catheter   09/26/16    1830    -   432          Labs/Imaging Results for orders placed or performed during the hospital encounter of 12/02/17 (from the past 48 hour(s))  Comprehensive metabolic panel     Status: Abnormal   Collection Time: 12/02/17  1:07 PM  Result Value Ref Range   Sodium 142 135 - 145 mmol/L   Potassium 4.8 3.5 - 5.1 mmol/L   Chloride 99 98 - 111 mmol/L   CO2 26  22 - 32 mmol/L   Glucose, Bld 88 70 - 99 mg/dL   BUN 68 (H) 8 - 23 mg/dL   Creatinine, Ser 4.09 (H) 0.44 - 1.00 mg/dL   Calcium 8.7 (L) 8.9 - 10.3 mg/dL   Total Protein 6.6 6.5 - 8.1 g/dL   Albumin 2.8 (L) 3.5 - 5.0 g/dL   AST 19 15 - 41 U/L   ALT 11 0 - 44 U/L   Alkaline Phosphatase 95 38 - 126 U/L   Total Bilirubin 1.5 (H) 0.3 - 1.2 mg/dL   GFR calc non Af Amer 5 (L) >60 mL/min   GFR calc Af Amer 6 (L) >60 mL/min   Anion gap 17 (H) 5 - 15    Comment: Performed at [facility], [address], [place], [place] [address]  CBC with Differential/Platelet     Status: Abnormal   Collection Time: 12/02/17  1:07 PM  Result Value Ref Range   WBC 10.5 4.0 - 10.5 K/uL   RBC 3.44 (L) 3.87 - 5.11 MIL/uL   Hemoglobin 9.8 (L) 12.0 - 15.0 g/dL   HCT 47.8 (L) 29.5 - 62.1 %   MCV 102.3 (H) 80.0 - 100.0 fL   MCH  28.5 26.0 - 34.0 pg   MCHC 27.8 (L) 30.0 - 36.0 g/dL   RDW 30.8 (H) 65.7 - 84.6 %   Platelets 234 150 - 400 K/uL   nRBC 0.0 0.0 - 0.2 %   Neutrophils Relative % 83 %   Neutro Abs 8.7 (H) 1.7 - 7.7 K/uL   Lymphocytes Relative 9 %   Lymphs Abs 0.9 0.7 - 4.0 K/uL   Monocytes Relative 7 %   Monocytes Absolute 0.7 0.1 - 1.0 K/uL   Eosinophils Relative 0 %   Eosinophils Absolute 0.0 0.0 - 0.5 K/uL   Basophils Relative 0 %   Basophils Absolute 0.0 0.0 - 0.1 K/uL   Immature Granulocytes 1 %   Abs Immature Granulocytes 0.09 (H) 0.00 - 0.07 K/uL    Comment: Performed at  [facility], [address], [place], [place] [address]  Ammonia     Status: None   Collection Time: 12/02/17  1:07 PM  Result Value Ref Range   Ammonia 11 9 - 35 umol/L    Comment: Performed at [facility], [address], [place], [place] [address]  I-Stat CG4 Lactic Acid, ED     Status: Abnormal   Collection Time: 12/02/17  1:15 PM  Result Value Ref Range   Lactic Acid, Venous 0.44 (L) 0.5 - 1.9 mmol/L  I-Stat venous blood gas, ED     Status: Abnormal   Collection Time: 12/02/17  1:15 PM  Result Value Ref Range   pH, Ven 7.335 7.250 - 7.430   pCO2, Ven 52.4 44.0 - 60.0 mmHg   pO2, Ven 54.0 (H) 32.0 - 45.0 mmHg   Bicarbonate 27.9 20.0 - 28.0 mmol/L   TCO2 30 22 - 32 mmol/L   O2 Saturation 85.0 %   Acid-Base Excess 2.0 0.0 - 2.0 mmol/L   Patient temperature HIDE    Sample type VENOUS   I-stat chem 8, ed     Status: Abnormal   Collection Time: 12/02/17  1:15 PM  Result Value Ref Range   Sodium 142 135 - 145 mmol/L   Potassium 4.5 3.5 - 5.1 mmol/L   Chloride 104 98 - 111 mmol/L   BUN 69 (H) 8 - 23 mg/dL   Creatinine, Ser  7.80 (H) 0.44 - 1.00 mg/dL   Glucose, Bld 96 70 - 99 mg/dL   Calcium, Ion 4.09 (L) 1.15 - 1.40 mmol/L   TCO2 29 22 - 32 mmol/L   Hemoglobin 9.5 (L) 12.0 - 15.0 g/dL   HCT 81.1 (L) 91.4 - 78.2 %   Ct Chest Wo Contrast  Result Date: 12/02/2017 CLINICAL DATA:  Differentiated  sepsis with septic shock EXAM: CT CHEST WITHOUT CONTRAST TECHNIQUE: Multidetector CT imaging of the chest was performed following the standard protocol without IV contrast. COMPARISON:  03/14/2017 chest CT, same day CXR FINDINGS: Cardiovascular: Top-normal heart size with coronary arteriosclerosis. No pericardial effusion or thickening. Atherosclerosis of the thoracic aorta and branch vessels with stent in the proximal left subclavian artery. The unenhanced pulmonary vasculature is unremarkable. Mediastinum/Nodes: Assessment for hilar adenopathy is limited due to lack of IV contrast. 15 mm short axis right paratracheal lymph node is identified versus 9 mm previously. Small prevascular lymph nodes are noted subcentimeter mass in dimension. Calcified mildly enlarged thyroid gland consistent with multinodular goiter. Trachea is patent with slight luminal narrowing of the right mainstem bronchus. Lungs/Pleura: Interval increase in right-sided pleural effusion now moderate to large with areas of loculation along the anterior aspect. Adjacent compressive atelectasis and consolidation due to the fusion is identified with only minimal aeration of right upper lobe currently seen. Soft tissue debris noted within middle and right lower lobe bronchi likely contributing to the areas of atelectasis and consolidation. Trace atelectasis at the left lung base. Upper Abdomen: Redemonstration of hypodense suprarenal mass measuring up to 4.2 cm may reflect an adrenal adenoma off the anterior limb. This is relatively stable in appearance. Otherwise, unremarkable upper abdomen. Musculoskeletal: No acute osseous abnormality. Chronic compression deformities of the thoracolumbar spine at T6, T7, T11 through L1, greatest at L1. IMPRESSION: 1. Interval increase in moderate to large right pleural effusion with compressive atelectasis and pulmonary consolidation with minimal aeration of the right upper lobe now noted. 2. Soft tissue  debris/mucous impaction within right middle and lower lobe bronchi likely contributing to postobstructive consolidation and atelectasis. 3. Trace left effusion with atelectasis. 4. Coronary arteriosclerosis and aortic atherosclerosis. Findings are stable in appearance. Stent in the left proximal subclavian artery. 5. Stable left adrenal mass compatible with adenoma. 6. Chronic midthoracic the thoracolumbar compression fractures. Aortic atherosclerosis Electronically Signed   By: Tollie Eth M.D.   On: 12/02/2017 19:21   Dg Chest Port 1 View  Result Date: 12/02/2017 CLINICAL DATA:  Acute mental status change.  Fever. EXAM: PORTABLE CHEST 1 VIEW COMPARISON:  May 22, 2017 FINDINGS: The right hemithorax is now almost completely opacified. Visualized cardiomediastinal silhouette and left lung are normal. A left-sided vascular stent is again identified. IMPRESSION: Near complete opacification of the right hemithorax likely represents fluid and underlying opacity. The finding is indeterminate. A CT scan could further evaluate. Electronically Signed   By: Gerome Sam III M.D   On: 12/02/2017 13:49    Pending Labs Unresulted Labs (From admission, onward)    Start     Ordered   12/03/17 0500  CBC with Differential/Platelet  Tomorrow morning,   R     12/02/17 1820   12/03/17 0500  Basic metabolic panel  Tomorrow morning,   R     12/02/17 1820   12/03/17 0500  Phosphorus  Tomorrow morning,   R     12/02/17 1820   12/03/17 0500  Magnesium  Tomorrow morning,   R     12/02/17  1820   12/02/17 1833  Procalcitonin - Baseline  ONCE - STAT,   STAT     12/02/17 1832   12/02/17 1823  Iron and TIBC  Add-on,   R     12/02/17 1822   12/02/17 1823  Ferritin  Add-on,   R     12/02/17 1822   12/02/17 1821  Hemoglobin A1c  Add-on,   R    Comments:  To assess prior glycemic control    12/02/17 1820   12/02/17 1818  MRSA PCR Screening  Once,   R    Question:  Patient immune status  Answer:  Normal   12/02/17 1819    12/02/17 1255  Urinalysis, Routine w reflex microscopic  ONCE - STAT,   STAT     12/02/17 1255   12/02/17 1252  Blood culture (routine x 2)  BLOOD CULTURE X 2,   STAT     12/02/17 1252   Unscheduled  Occult blood card to lab, stool RN will collect  As needed,   R    Question:  Specimen to be collected by?  Answer:  RN will collect   12/02/17 1823          Vitals/Pain Today's Vitals   12/02/17 1600 12/02/17 1615 12/02/17 1633 12/02/17 1858  BP: (!) 115/45 (!) 112/41 (!) 110/48 121/86  Pulse: (!) 58 (!) 59 63 64  Resp: 15 15 18 19   Temp:   (!) 97.5 F (36.4 C)   TempSrc:   Oral   SpO2: 92% (!) 86% (!) 88% 96%  PainSc:        Isolation Precautions No active isolations  Medications Medications  lidocaine (PF) (XYLOCAINE) 1 % injection (has no administration in time range)  sodium chloride 0.9 % bolus 250 mL (has no administration in time range)  sodium chloride 0.9 % bolus 1,000 mL (0 mLs Intravenous Stopped 12/02/17 1900)    And  sodium chloride 0.9 % bolus 1,000 mL (has no administration in time range)  metroNIDAZOLE (FLAGYL) IVPB 500 mg (500 mg Intravenous New Bag/Given 12/02/17 1726)  ceFEPIme (MAXIPIME) 1 g in sodium chloride 0.9 % 100 mL IVPB (has no administration in time range)  ipratropium-albuterol (DUONEB) 0.5-2.5 (3) MG/3ML nebulizer solution 3 mL (has no administration in time range)  heparin injection 5,000 Units (has no administration in time range)  insulin aspart (novoLOG) injection 0-9 Units (has no administration in time range)  aspirin chewable tablet 81 mg (has no administration in time range)  atorvastatin (LIPITOR) tablet 40 mg (has no administration in time range)  midodrine (PROAMATINE) tablet 10 mg (has no administration in time range)  sertraline (ZOLOFT) tablet 50 mg (has no administration in time range)  sevelamer carbonate (RENVELA) tablet 2,400 mg (has no administration in time range)  thiamine (B-1) injection 100 mg (100 mg Intravenous Given  12/02/17 1404)  ceFEPIme (MAXIPIME) 2 g in sodium chloride 0.9 % 100 mL IVPB (0 g Intravenous Stopped 12/02/17 1515)  vancomycin (VANCOCIN) IVPB 1000 mg/200 mL premix (0 mg Intravenous Stopped 12/02/17 1702)    Mobility non-ambulatory

## 2017-12-03 DIAGNOSIS — L8990 Pressure ulcer of unspecified site, unspecified stage: Secondary | ICD-10-CM

## 2017-12-03 LAB — CBC WITH DIFFERENTIAL/PLATELET
Abs Immature Granulocytes: 0.06 10*3/uL (ref 0.00–0.07)
Basophils Absolute: 0 10*3/uL (ref 0.0–0.1)
Basophils Relative: 0 %
Eosinophils Absolute: 0 10*3/uL (ref 0.0–0.5)
Eosinophils Relative: 0 %
HCT: 34.2 % — ABNORMAL LOW (ref 36.0–46.0)
Hemoglobin: 9.8 g/dL — ABNORMAL LOW (ref 12.0–15.0)
Immature Granulocytes: 1 %
Lymphocytes Relative: 6 %
Lymphs Abs: 0.5 10*3/uL — ABNORMAL LOW (ref 0.7–4.0)
MCH: 28.7 pg (ref 26.0–34.0)
MCHC: 28.7 g/dL — ABNORMAL LOW (ref 30.0–36.0)
MCV: 100 fL (ref 80.0–100.0)
Monocytes Absolute: 0.6 10*3/uL (ref 0.1–1.0)
Monocytes Relative: 6 %
NRBC: 0 % (ref 0.0–0.2)
Neutro Abs: 7.6 10*3/uL (ref 1.7–7.7)
Neutrophils Relative %: 87 %
Platelets: 228 10*3/uL (ref 150–400)
RBC: 3.42 MIL/uL — ABNORMAL LOW (ref 3.87–5.11)
RDW: 15.8 % — ABNORMAL HIGH (ref 11.5–15.5)
WBC: 8.8 10*3/uL (ref 4.0–10.5)

## 2017-12-03 LAB — BLOOD CULTURE ID PANEL (REFLEXED)
Acinetobacter baumannii: NOT DETECTED
CANDIDA GLABRATA: NOT DETECTED
CANDIDA KRUSEI: NOT DETECTED
Candida albicans: NOT DETECTED
Candida parapsilosis: NOT DETECTED
Candida tropicalis: NOT DETECTED
ENTEROBACTER CLOACAE COMPLEX: NOT DETECTED
Enterobacteriaceae species: NOT DETECTED
Enterococcus species: NOT DETECTED
Escherichia coli: NOT DETECTED
Haemophilus influenzae: NOT DETECTED
Klebsiella oxytoca: NOT DETECTED
Klebsiella pneumoniae: NOT DETECTED
Listeria monocytogenes: NOT DETECTED
Methicillin resistance: DETECTED — AB
Neisseria meningitidis: NOT DETECTED
Proteus species: NOT DETECTED
Pseudomonas aeruginosa: NOT DETECTED
Serratia marcescens: NOT DETECTED
Staphylococcus aureus (BCID): NOT DETECTED
Staphylococcus species: DETECTED — AB
Streptococcus agalactiae: NOT DETECTED
Streptococcus pneumoniae: NOT DETECTED
Streptococcus pyogenes: NOT DETECTED
Streptococcus species: NOT DETECTED

## 2017-12-03 LAB — GLUCOSE, CAPILLARY
GLUCOSE-CAPILLARY: 104 mg/dL — AB (ref 70–99)
GLUCOSE-CAPILLARY: 96 mg/dL (ref 70–99)
Glucose-Capillary: 102 mg/dL — ABNORMAL HIGH (ref 70–99)
Glucose-Capillary: 79 mg/dL (ref 70–99)
Glucose-Capillary: 94 mg/dL (ref 70–99)
Glucose-Capillary: 97 mg/dL (ref 70–99)

## 2017-12-03 LAB — BASIC METABOLIC PANEL
Anion gap: 20 — ABNORMAL HIGH (ref 5–15)
BUN: 80 mg/dL — ABNORMAL HIGH (ref 8–23)
CO2: 22 mmol/L (ref 22–32)
Calcium: 8.5 mg/dL — ABNORMAL LOW (ref 8.9–10.3)
Chloride: 101 mmol/L (ref 98–111)
Creatinine, Ser: 7.93 mg/dL — ABNORMAL HIGH (ref 0.44–1.00)
GFR calc Af Amer: 5 mL/min — ABNORMAL LOW (ref >60)
GFR calc non Af Amer: 5 mL/min — ABNORMAL LOW (ref >60)
Glucose, Bld: 97 mg/dL (ref 70–99)
Potassium: 5.4 mmol/L — ABNORMAL HIGH (ref 3.5–5.1)
Sodium: 143 mmol/L (ref 135–145)

## 2017-12-03 LAB — PHOSPHORUS: Phosphorus: 6.1 mg/dL — ABNORMAL HIGH (ref 2.5–4.6)

## 2017-12-03 LAB — MRSA PCR SCREENING: MRSA by PCR: POSITIVE — AB

## 2017-12-03 LAB — MAGNESIUM: Magnesium: 2.4 mg/dL (ref 1.7–2.4)

## 2017-12-03 MED ORDER — HEPARIN SODIUM (PORCINE) 1000 UNIT/ML DIALYSIS: 1000.0000 [IU] | INTRAMUSCULAR | Status: DC | PRN

## 2017-12-03 MED ORDER — VANCOMYCIN HCL IN DEXTROSE 1-5 GM/200ML-% IV SOLN
1000.0000 mg | INTRAVENOUS | Status: DC
  Administered 2017-12-03: 1000 mg via INTRAVENOUS

## 2017-12-03 MED ORDER — METRONIDAZOLE IN NACL 5-0.79 MG/ML-% IV SOLN
500.0000 mg | Freq: Three times a day (TID) | INTRAVENOUS | Status: DC
  Administered 2017-12-03 – 2017-12-04 (×4): 500 mg via INTRAVENOUS
  Filled 2017-12-03 (×5): qty 100

## 2017-12-03 MED ORDER — VANCOMYCIN HCL IN DEXTROSE 1-5 GM/200ML-% IV SOLN
INTRAVENOUS | Status: AC
  Filled 2017-12-03: qty 200

## 2017-12-03 MED ORDER — PENTAFLUOROPROP-TETRAFLUOROETH EX AERO: 1.0000 "application " | INHALATION_SPRAY | CUTANEOUS | Status: DC | PRN

## 2017-12-03 MED ORDER — ORAL CARE MOUTH RINSE
15.0000 mL | Freq: Two times a day (BID) | OROMUCOSAL | Status: DC
  Administered 2017-12-03 – 2017-12-05 (×4): 15 mL via OROMUCOSAL

## 2017-12-03 MED ORDER — IPRATROPIUM-ALBUTEROL 0.5-2.5 (3) MG/3ML IN SOLN
3.0000 mL | Freq: Three times a day (TID) | RESPIRATORY_TRACT | Status: DC
  Administered 2017-12-03: 3 mL via RESPIRATORY_TRACT
  Filled 2017-12-03 (×2): qty 3

## 2017-12-03 MED ORDER — ALBUTEROL SULFATE (2.5 MG/3ML) 0.083% IN NEBU: 2.5000 mg | INHALATION_SOLUTION | RESPIRATORY_TRACT | Status: DC | PRN

## 2017-12-03 MED ORDER — LIDOCAINE HCL (PF) 1 % IJ SOLN: 5.0000 mL | INTRAMUSCULAR | Status: DC | PRN

## 2017-12-03 MED ORDER — SODIUM CHLORIDE 0.9 % IV SOLN: 100.0000 mL | INTRAVENOUS | Status: DC | PRN

## 2017-12-03 MED ORDER — CHLORHEXIDINE GLUCONATE 0.12 % MT SOLN
15.0000 mL | Freq: Two times a day (BID) | OROMUCOSAL | Status: DC
  Administered 2017-12-03 – 2017-12-06 (×7): 15 mL via OROMUCOSAL
  Filled 2017-12-03 (×7): qty 15

## 2017-12-03 MED ORDER — LIDOCAINE-PRILOCAINE 2.5-2.5 % EX CREA: 1.0000 "application " | TOPICAL_CREAM | CUTANEOUS | Status: DC | PRN

## 2017-12-03 MED ORDER — IPRATROPIUM-ALBUTEROL 0.5-2.5 (3) MG/3ML IN SOLN
3.0000 mL | Freq: Four times a day (QID) | RESPIRATORY_TRACT | Status: DC
  Administered 2017-12-03: 3 mL via RESPIRATORY_TRACT
  Filled 2017-12-03 (×2): qty 3

## 2017-12-03 NOTE — Progress Notes (Signed)
PROGRESS NOTE  Bridget Manning RUE:[id number] DOB: 11-08-1945 DOA: 12/02/2017 PCP: Rogers Seeds, MD  HPI/Recap of past 24 hours: Bridget Manning is a 72 y.o. female with past medical history significant for former tobacco user, end-stage renal disease on dialysis Monday Wednesday Friday, type 2 diabetes, chronic hypotension on midodrine, who presented to [facility] ED from SNF after being found with worsening weakness and a fever of 100.8. Large left pleural effusion on cxr done in the ESD with hypoxia. TRH asked to admit.  12/03/2017: Pleasant and examined at her bedside in the dialysis center.  She appears uncomfortable due to dyspnea.  She is on nonrebreather.  Goal is to remove 2 L in hemodialysis today.  She denies any chest pain however she admits to feeling poorly.  Assessment/Plan: Active Problems:   Acute respiratory failure with hypoxia (HCC)  Acute respiratory failure with hypoxia secondary to large right pleural effusion versus atelectasis from mucous plugging versus HCAP versus aspiration pneumonitis Reviewed chest x-ray done on admission which revealed complete blackout of right lung. Differentials include atelectasis from mucous plugging versus large pleural effusion versus others Chest ct wo contrast done on 12/03/17 independent reviewed revealed large right pleural effusion Right thoracentesis has been ordered Continue O2 supplementation to maintain O2 saturation greater than 92%  Hyperkalemia K+ 5.4 defer to nephrology to manage her electrolytes Repeat BMP am  Suspected dysphagia Speech therapy consulted to assess for swallowing safety  Acute metabolic encephalopathy suspect secondary to hypoxia versus possible infective process versus others, pesrsistent Per her sister, at her baseline she is more alert with no prior history of dementia that she knows of Reorient as needed C/w  IV antibiotics empirically Procalcitonin 14 Monitor fever curve and WBC Repeat CBC in the  morning  Chronic hypotension C/w midodrine Monitor vital signs Maintain map above 65 if possible  End-stage renal disease on hemodialysis Monday Wednesday Friday C/w hemodialysis Nephrology following  Physical debility/generalized weakness PT to assess Oral supplementation when able to pass a swallow evaluation Monitor electrolytes Monitor H&H Fall precautions  Anemia of chronic disease secondary to end-stage renal disease Hemoglobin dropped from baseline of 12 to 9.5 Defer to nephrology to give iron supplement or RBC stimulating agents Repeat CBC in the morning  Type 2 diabetes A1c 5.2 Insulin sliding scale every 4 hours as needed for hyperglycemia CBGs every 4 hours Avoid hypoglycemia  Risk: High risk for decompensation due to acute hypoxic respiratory failure requiring high level of oxygen in the setting of suspected atelectasis versus large pleural effusion, end-stage renal disease on dialysis, multiple comorbidities, and advanced age.  DVT prophylaxis: Subcu heparin 3 times daily  Code Status: DNR  Family Communication: Spoke to sister who is her next of kin.  Disposition Plan: DC back to SNF when hemodynamically stable. Consults called: None  Admission status: Inpatient status    Objective: Vitals:   12/03/17 1115 12/03/17 1125 12/03/17 1233 12/03/17 1311  BP: (!) 108/53 (!) 122/44 135/78   Pulse: 60 61 69   Resp:  19 18   Temp:  97.7 F (36.5 C) 97.9 F (36.6 C)   TempSrc:  Axillary Oral   SpO2:  100% 99% 92%  Weight:  63.6 kg      Intake/Output Summary (Last 24 hours) at 12/03/2017 1357 Last data filed at 12/03/2017 1300 Gross per 24 hour  Intake 350 ml  Output 1499 ml  Net -1149 ml   Filed Weights   12/02/17 2032 12/03/17 0715 12/03/17 1125  Weight: 65 kg  65.7 kg 63.6 kg    Exam:  . General: 72 y.o. year-old female WD WN appears uncomfortable due to dyspnea. . Cardiovascular: RRR no rubs or gallops. No JVD or thyromegaly.    Marland Kitchen Respiratory: Rhonchorous sounds diffusely. No wheezes. Poor inspiratory efforts. . Abdomen: Soft nontender nondistended with normal bowel sounds x4 quadrants. . Musculoskeletal: No lower extremity edema. 2/4 pulses in all 4 extremities. Marland Kitchen Psychiatry: Mood is appropriate for condition and setting   Data Reviewed: CBC: Recent Labs  Lab 12/02/17 1307 12/02/17 1315 12/03/17 0557  WBC 10.5  --  8.8  NEUTROABS 8.7*  --  7.6  HGB 9.8* 9.5* 9.8*  HCT 35.2* 28.0* 34.2*  MCV 102.3*  --  100.0  PLT 234  --  228   Basic Metabolic Panel: Recent Labs  Lab 12/02/17 1307 12/02/17 1315 12/03/17 0557  NA 142 142 143  K 4.8 4.5 5.4*  CL 99 104 101  CO2 26  --  22  GLUCOSE 88 96 97  BUN 68* 69* 80*  CREATININE 7.60* 7.80* 7.93*  CALCIUM 8.7*  --  8.5*  MG  --   --  2.4  PHOS  --   --  6.1*   GFR: CrCl cannot be calculated (Unknown ideal weight.). Liver Function Tests: Recent Labs  Lab 12/02/17 1307  AST 19  ALT 11  ALKPHOS 95  BILITOT 1.5*  PROT 6.6  ALBUMIN 2.8*   No results for input(s): LIPASE, AMYLASE in the last 168 hours. Recent Labs  Lab 12/02/17 1307  AMMONIA 11   Coagulation Profile: No results for input(s): INR, PROTIME in the last 168 hours. Cardiac Enzymes: No results for input(s): CKTOTAL, CKMB, CKMBINDEX, TROPONINI in the last 168 hours. BNP (last 3 results) No results for input(s): PROBNP in the last 8760 hours. HbA1C: Recent Labs    12/02/17 2100  HGBA1C 5.2   CBG: Recent Labs  Lab 12/02/17 2034 12/03/17 0009 12/03/17 0444 12/03/17 1046 12/03/17 1233  GLUCAP 114* 102* 94 104* 97   Lipid Profile: No results for input(s): CHOL, HDL, LDLCALC, TRIG, CHOLHDL, LDLDIRECT in the last 72 hours. Thyroid Function Tests: No results for input(s): TSH, T4TOTAL, FREET4, T3FREE, THYROIDAB in the last 72 hours. Anemia Panel: Recent Labs    12/02/17 2100  FERRITIN 2,496*  TIBC 112*  IRON 25*   Urine analysis:    Component Value Date/Time    COLORURINE AMBER (A) 09/26/2016 0600   APPEARANCEUR TURBID (A) 09/26/2016 0600   LABSPEC 1.009 09/26/2016 0600   PHURINE 8.0 09/26/2016 0600   GLUCOSEU 50 (A) 09/26/2016 0600   HGBUR SMALL (A) 09/26/2016 0600   BILIRUBINUR NEGATIVE 09/26/2016 0600   KETONESUR NEGATIVE 09/26/2016 0600   PROTEINUR 100 (A) 09/26/2016 0600   NITRITE NEGATIVE 09/26/2016 0600   LEUKOCYTESUR LARGE (A) 09/26/2016 0600   Sepsis Labs: @LABRCNTIP (procalcitonin:4,lacticidven:4)  ) Recent Results (from the past 240 hour(s))  Blood culture (routine x 2)     Status: None (Preliminary result)   Collection Time: 12/02/17  1:56 PM  Result Value Ref Range Status   Specimen Description BLOOD SITE NOT SPECIFIED  Final   Special Requests   Final    BOTTLES DRAWN AEROBIC AND ANAEROBIC Blood Culture adequate volume   Culture  Setup Time   Final    GRAM POSITIVE COCCI ANAEROBIC BOTTLE ONLY Organism ID to follow CRITICAL RESULT CALLED TO, READ BACK BY AND VERIFIED WITH: Hollie Beach [id number] MLM Performed at [facility], [address] [place], [place] [address]    Culture GRAM POSITIVE COCCI  Final   Report Status PENDING  Incomplete  Blood Culture ID Panel (Reflexed)     Status: Abnormal   Collection Time: 12/02/17  1:56 PM  Result Value Ref Range Status   Enterococcus species NOT DETECTED NOT DETECTED Final   Listeria monocytogenes NOT DETECTED NOT DETECTED Final   Staphylococcus species DETECTED (A) NOT DETECTED Final    Comment: Methicillin (oxacillin) resistant coagulase negative staphylococcus. Possible blood culture contaminant (unless isolated from more than one blood culture draw or clinical case suggests pathogenicity). No antibiotic treatment is indicated for blood  culture contaminants. CRITICAL RESULT CALLED TO, READ BACK BY AND VERIFIED WITH: PHARMD M MACCIA [id number] MLM    Staphylococcus aureus (BCID) NOT DETECTED NOT DETECTED Final   Methicillin resistance DETECTED (A) NOT DETECTED  Final    Comment: CRITICAL RESULT CALLED TO, READ BACK BY AND VERIFIED WITH: PHARMD M MACCIA [id number] MLM    Streptococcus species NOT DETECTED NOT DETECTED Final   Streptococcus agalactiae NOT DETECTED NOT DETECTED Final   Streptococcus pneumoniae NOT DETECTED NOT DETECTED Final   Streptococcus pyogenes NOT DETECTED NOT DETECTED Final   Acinetobacter baumannii NOT DETECTED NOT DETECTED Final   Enterobacteriaceae species NOT DETECTED NOT DETECTED Final   Enterobacter cloacae complex NOT DETECTED NOT DETECTED Final   Escherichia coli NOT DETECTED NOT DETECTED Final   Klebsiella oxytoca NOT DETECTED NOT DETECTED Final   Klebsiella pneumoniae NOT DETECTED NOT DETECTED Final   Proteus species NOT DETECTED NOT DETECTED Final   Serratia marcescens NOT DETECTED NOT DETECTED Final   Haemophilus influenzae NOT DETECTED NOT DETECTED Final   Neisseria meningitidis NOT DETECTED NOT DETECTED Final   Pseudomonas aeruginosa NOT DETECTED NOT DETECTED Final   Candida albicans NOT DETECTED NOT DETECTED Final   Candida glabrata NOT DETECTED NOT DETECTED Final   Candida krusei NOT DETECTED NOT DETECTED Final   Candida parapsilosis NOT DETECTED NOT DETECTED Final   Candida tropicalis NOT DETECTED NOT DETECTED Final    Comment: Performed at [facility], [address] [place], [place] [address]      Studies: Ct Chest Wo Contrast  Result Date: 12/02/2017 CLINICAL DATA:  Differentiated sepsis with septic shock EXAM: CT CHEST WITHOUT CONTRAST TECHNIQUE: Multidetector CT imaging of the chest was performed following the standard protocol without IV contrast. COMPARISON:  03/14/2017 chest CT, same day CXR FINDINGS: Cardiovascular: Top-normal heart size with coronary arteriosclerosis. No pericardial effusion or thickening. Atherosclerosis of the thoracic aorta and branch vessels with stent in the proximal left subclavian artery. The unenhanced pulmonary vasculature is unremarkable. Mediastinum/Nodes:  Assessment for hilar adenopathy is limited due to lack of IV contrast. 15 mm short axis right paratracheal lymph node is identified versus 9 mm previously. Small prevascular lymph nodes are noted subcentimeter mass in dimension. Calcified mildly enlarged thyroid gland consistent with multinodular goiter. Trachea is patent with slight luminal narrowing of the right mainstem bronchus. Lungs/Pleura: Interval increase in right-sided pleural effusion now moderate to large with areas of loculation along the anterior aspect. Adjacent compressive atelectasis and consolidation due to the fusion is identified with only minimal aeration of right upper lobe currently seen. Soft tissue debris noted within middle and right lower lobe bronchi likely contributing to the areas of atelectasis and consolidation. Trace atelectasis at the left lung base. Upper Abdomen: Redemonstration of hypodense suprarenal mass measuring up to 4.2 cm may reflect an adrenal adenoma off the anterior  limb. This is relatively stable in appearance. Otherwise, unremarkable upper abdomen. Musculoskeletal: No acute osseous abnormality. Chronic compression deformities of the thoracolumbar spine at T6, T7, T11 through L1, greatest at L1. IMPRESSION: 1. Interval increase in moderate to large right pleural effusion with compressive atelectasis and pulmonary consolidation with minimal aeration of the right upper lobe now noted. 2. Soft tissue debris/mucous impaction within right middle and lower lobe bronchi likely contributing to postobstructive consolidation and atelectasis. 3. Trace left effusion with atelectasis. 4. Coronary arteriosclerosis and aortic atherosclerosis. Findings are stable in appearance. Stent in the left proximal subclavian artery. 5. Stable left adrenal mass compatible with adenoma. 6. Chronic midthoracic the thoracolumbar compression fractures. Aortic atherosclerosis Electronically Signed   By: Tollie Eth M.D.   On: 12/02/2017 19:21     Scheduled Meds: . aspirin  81 mg Oral Daily  . atorvastatin  40 mg Oral QHS  . Chlorhexidine Gluconate Cloth  6 each Topical Q0600  . [START ON 12/04/2017] doxercalciferol  1 mcg Intravenous Q M,W,F-HD  . heparin injection (subcutaneous)  5,000 Units Subcutaneous Q8H  . insulin aspart  0-9 Units Subcutaneous Q4H  . ipratropium-albuterol  3 mL Nebulization TID  . midodrine  10 mg Oral Q M,W,F  . sertraline  50 mg Oral Daily  . sevelamer carbonate  2,400 mg Oral TID WC  . vancomycin variable dose per unstable renal function (pharmacist dosing)   Does not apply See admin instructions    Continuous Infusions: . ceFEPime (MAXIPIME) IV    . metronidazole 500 mg (12/03/17 1321)  . sodium chloride    . sodium chloride    . vancomycin    . vancomycin Stopped (12/03/17 1330)     LOS: 1 day     Darlin Drop, MD [facility] Pager [phone]  If 7PM-7AM, please contact night-coverage [email or web address] Password [email or web address] 12/03/2017, 1:57 PM

## 2017-12-03 NOTE — Evaluation (Signed)
Clinical/Bedside Swallow Evaluation Patient Details  Name: Bridget Manning MRN: [id number] Date of Birth: 18-Nov-1945  Today's Date: 12/03/2017 Time: SLP Start Time (ACUTE ONLY): 1452 SLP Stop Time (ACUTE ONLY): 1511 SLP Time Calculation (min) (ACUTE ONLY): 19 min  Past Medical History:  Past Medical History:  Diagnosis Date  . Diabetes mellitus without complication (HCC)   . Renal disorder   . Stroke (HCC)   . UTI (urinary tract infection)    Past Surgical History:  Past Surgical History:  Procedure Laterality Date  . A/V FISTULAGRAM Left 03/05/2017   Procedure: A/V FISTULAGRAM;  Surgeon: Nada Libman, MD;  Location: [facility] INVASIVE CV LAB;  Service: Cardiovascular;  Laterality: Left;   HPI:  Bridget Manning is a 72 y.o. female with PMH significant for stroke, former tobacco use, ERSD on HD, type 2 DM, chronic hypotension, who presented to [facility] ED from SNF on 12/02/17 after being found with worsening weakness, fever,  AMS and acute respiratory failure with hypoxia- diagnosed with healthcare acquired pneumonia and ESRD. 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   Assessment / Plan / Recommendation Clinical Impression  Pt exhibited severe xerostomia marked by coating of dried secretions coating hard palate and palatine arches removed with concentrated oral care. Only able to remove partial amount of dried debris flaking from tongue. Ice chips and sips water assisted to provide moisture for oral hygiene. Pt responsive to basic questions intermittently and followed command during assessment needing feeding assist d/t bilateral hand contractures.  Generalized weakness lead to anterior loss with water without s/s aspiration however suspect she may be experiencing silent laryngeal intrusion. Discussed recommendations with RN including: frequent oral care (place pt on adult oral care protocol high risk), give pt several ice chips if alert every few hours to mitigate xerostomia. Plan for MBS tomorrow to fully evaluate swallow function.     SLP Visit Diagnosis: Dysphagia, oropharyngeal phase (R13.12)    Aspiration Risk  Moderate aspiration risk    Diet Recommendation Ice chips PRN after oral care        Other  Recommendations Oral Care Recommendations: Oral care QID;Oral care prior to ice chip/H20   Follow up Recommendations (TBD)      Frequency and Duration            Prognosis        Swallow Study   General HPI: Bridget Manning is a 72 y.o. female with PMH significant for stroke, former tobacco use, ERSD on HD, type 2 DM, chronic hypotension, who presented to [facility] ED from SNF on 12/02/17 after being found with worsening weakness, fever,  AMS and acute respiratory failure with hypoxia- diagnosed with healthcare acquired pneumonia and ESRD. 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 Type of Study: Bedside Swallow Evaluation Previous Swallow Assessment: (none) Diet Prior to this Study: NPO Temperature Spikes Noted: No Respiratory Status: Nasal cannula History of Recent Intubation: No Behavior/Cognition: Cooperative;Lethargic/Drowsy Oral Cavity Assessment: Dried secretions;Other (comment);Excessive secretions;Dry(see  impression) Oral Care Completed by SLP: Yes Oral Cavity -  Dentition: Edentulous Self-Feeding Abilities: Total assist Patient Positioning: Upright in bed Baseline Vocal Quality: Normal Volitional Cough: Cognitively unable to elicit Volitional Swallow: Unable to elicit    Oral/Motor/Sensory Function Overall Oral Motor/Sensory Function: Generalized oral weakness   Ice Chips Ice chips: Impaired Oral Phase Impairments: Reduced lingual movement/coordination Pharyngeal Phase Impairments: (none)   Thin Liquid Thin Liquid: Impaired Presentation: Cup Oral Phase Impairments: Reduced labial seal Pharyngeal  Phase Impairments: Suspected delayed Swallow    Nectar Thick Nectar Thick Liquid: Not tested   Honey Thick Honey Thick Liquid: Not tested   Puree Puree: Within functional limits   Solid     Solid: Not tested      Royce MacadamiaLitaker, Diamonte Stavely Willis 12/03/2017,4:21 PM  Breck CoonsLisa Willis Lonell FaceLitaker M.Ed Nurse, children'sCCC-SLP Speech-Language Pathologist Pager [phone] Office [phone]

## 2017-12-03 NOTE — Progress Notes (Signed)
PHARMACY - PHYSICIAN COMMUNICATION CRITICAL VALUE ALERT - BLOOD CULTURE IDENTIFICATION (BCID)  Bridget Manning is an 72 y.o. female who [facility] on 12/02/2017 with concern for pneumonia  Assessment:  1/2 coag neg staph likely contaminant   Name of physician (or Provider) Contacted: Dr Margo AyeHall  Current antibiotics: Vanc cefepime metronidazole  Changes to prescribed antibiotics recommended:  Continue current abx  Results for orders placed or performed during the hospital encounter of 12/02/17  Blood Culture ID Panel (Reflexed) (Collected: 12/02/2017  1:56 PM)  Result Value Ref Range   Enterococcus species NOT DETECTED NOT DETECTED   Listeria monocytogenes NOT DETECTED NOT DETECTED   Staphylococcus species DETECTED (A) NOT DETECTED   Staphylococcus aureus (BCID) NOT DETECTED NOT DETECTED   Methicillin resistance DETECTED (A) NOT DETECTED   Streptococcus species NOT DETECTED NOT DETECTED   Streptococcus agalactiae NOT DETECTED NOT DETECTED   Streptococcus pneumoniae NOT DETECTED NOT DETECTED   Streptococcus pyogenes NOT DETECTED NOT DETECTED   Acinetobacter baumannii NOT DETECTED NOT DETECTED   Enterobacteriaceae species NOT DETECTED NOT DETECTED   Enterobacter cloacae complex NOT DETECTED NOT DETECTED   Escherichia coli NOT DETECTED NOT DETECTED   Klebsiella oxytoca NOT DETECTED NOT DETECTED   Klebsiella pneumoniae NOT DETECTED NOT DETECTED   Proteus species NOT DETECTED NOT DETECTED   Serratia marcescens NOT DETECTED NOT DETECTED   Haemophilus influenzae NOT DETECTED NOT DETECTED   Neisseria meningitidis NOT DETECTED NOT DETECTED   Pseudomonas aeruginosa NOT DETECTED NOT DETECTED   Candida albicans NOT DETECTED NOT DETECTED   Candida glabrata NOT DETECTED NOT DETECTED   Candida krusei NOT DETECTED NOT DETECTED   Candida parapsilosis NOT DETECTED NOT DETECTED   Candida tropicalis NOT DETECTED NOT DETECTED   Isaac BlissMichael Dolan Xia, PharmD, BCPS, BCCCP Clinical  Pharmacist [phone]  Please check AMION for all [facility] numbers  12/03/2017 9:19 AM

## 2017-12-03 NOTE — Procedures (Signed)
Patient seen on Hemodialysis. QB 400, UF goal 2L Appears restless on hemodialysis-oxygen via nasal cannula.  Zetta BillsJay Christyna Letendre MD [facility]. Office # [phone] Pager # [phone] 9:45 AM

## 2017-12-03 NOTE — Progress Notes (Signed)
PT Cancellation Note  Patient Details Name: Bridget Manning MRN: [id number] DOB: 1945-01-24   Cancelled Treatment:    Reason Eval/Treat Not Completed: Patient at procedure or test/unavailable. Pt currently in HD. Will continue to follow and initiate PT evaluation when able.    Marylynn PearsonLaura D Nikolos Billig 12/03/2017, 9:42 AM   Conni SlipperLaura Coady Train, PT, DPT Acute Rehabilitation Services Pager: [phone] Office: [phone]

## 2017-12-03 NOTE — Progress Notes (Signed)
IR aware of thoracentesis request. Patient previously in hemodialysis today when IR available for procedure - will attempt thoracentesis again tomorrow morning.   Please call IR with questions or concerns.  Lynnette CaffeyShannon Laurabeth Yip, PA-C

## 2017-12-03 NOTE — Progress Notes (Signed)
Patient ID: Bridget Manning, female   DOB: November 15, 1945, 72 y.o.   MRN: [id number] [facility] Progress Note   Assessment/ Plan:   1.  Healthcare associated pneumonia: With complete whiteout of the right hemithorax and CT scan showing enlarging pleural effusion for which she is scheduled to undergo thoracentesis after dialysis.  She remains on supplemental oxygen.  On broad-spectrum antibiotic therapy. 2. ESRD: She missed her hemodialysis yesterday and is undergoing hemodialysis at this time.  Plan to repeat dialysis again tomorrow to get her back onto her usual outpatient schedule. 3. 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 5. Nutrition: She appears to have moderate protein calorie malnutrition and we will continue current renal diet with multivitamin and oral nutritional supplements. 6. Hypertension: Blood pressure on lower side, continue monitoring with dialysis/ultrafiltration.  Subjective:   Reports to be feeling weak.   Objective:   BP (!) 110/54   Pulse 62   Temp 97.9 F (36.6 C) (Oral)   Resp 20   Wt 65.7 kg   SpO2 100%   BMI 25.66 kg/m   Physical Exam: Gen: Appears comfortable/somnolent on dialysis. CVS: Pulse regular rhythm, normal rate, S1 and S2 normal Resp: Diminished breath sounds right side, fine rales left side Abd: Soft, obese, nontender Ext: Without lower extremity edema  Labs: BMET Recent Labs  Lab 12/02/17 1307 12/02/17 1315 12/03/17 0557  NA 142 142 143  K 4.8 4.5 5.4*  CL 99 104 101  CO2 26  --  22  GLUCOSE 88 96 97  BUN 68* 69* 80*  CREATININE 7.60* 7.80* 7.93*  CALCIUM 8.7*  --  8.5*  PHOS  --   --  6.1*   CBC Recent Labs  Lab 12/02/17 1307 12/02/17 1315 12/03/17 0557  WBC 10.5  --  8.8  NEUTROABS 8.7*  --  7.6  HGB 9.8* 9.5* 9.8*  HCT 35.2* 28.0* 34.2*  MCV 102.3*  --  100.0  PLT 234  --  228    Medications:    . aspirin  81 mg Oral Daily  . atorvastatin  40 mg Oral QHS  . Chlorhexidine Gluconate Cloth  6 each Topical Q0600  . [START ON 12/04/2017] doxercalciferol  1 mcg Intravenous Q M,W,F-HD  . heparin injection (subcutaneous)  5,000 Units Subcutaneous Q8H  . insulin aspart  0-9 Units Subcutaneous Q4H  . ipratropium-albuterol  3 mL Nebulization QID  . midodrine  10 mg Oral Q M,W,F  . sertraline  50 mg Oral Daily  . sevelamer carbonate  2,400 mg Oral TID WC  . vancomycin variable dose per unstable renal function (pharmacist dosing)   Does not apply See admin instructions   Zetta BillsJay Mamoudou Mulvehill, MD 12/03/2017, 9:40 AM

## 2017-12-03 NOTE — Progress Notes (Signed)
PT Cancellation Note  Patient Details Name: Bridget FuseMarion Skeet MRN: [id number] DOB: Jun 17, 1945   Cancelled Treatment:    Reason Eval/Treat Not Completed: Checked in on pt after HD; RN in room. Discussed with RN to hold PT at this time due to lethargy and appearing generally uncomfortable at this time. Will assess readiness to participate with PT at a later date.    Marylynn PearsonLaura D Emerita Berkemeier 12/03/2017, 1:27 PM   Conni SlipperLaura Ean Gettel, PT, DPT Acute Rehabilitation Services Pager: [phone] Office: [phone]

## 2017-12-04 ENCOUNTER — Inpatient Hospital Stay (HOSPITAL_COMMUNITY): Payer: Medicare Other

## 2017-12-04 ENCOUNTER — Encounter (HOSPITAL_COMMUNITY): Payer: Self-pay | Admitting: Physician Assistant

## 2017-12-04 HISTORY — PX: IR THORACENTESIS ASP PLEURAL SPACE W/IMG GUIDE: IMG5380

## 2017-12-04 LAB — BODY FLUID CELL COUNT WITH DIFFERENTIAL
Eos, Fluid: 0 %
LYMPHS FL: 0 %
Monocyte-Macrophage-Serous Fluid: 2 % — ABNORMAL LOW (ref 50–90)
Neutrophil Count, Fluid: 98 % — ABNORMAL HIGH (ref 0–25)
Total Nucleated Cell Count, Fluid: 612 cu mm (ref 0–1000)

## 2017-12-04 LAB — BASIC METABOLIC PANEL
ANION GAP: 19 — AB (ref 5–15)
BUN: 31 mg/dL — ABNORMAL HIGH (ref 8–23)
CO2: 23 mmol/L (ref 22–32)
Calcium: 8.7 mg/dL — ABNORMAL LOW (ref 8.9–10.3)
Chloride: 99 mmol/L (ref 98–111)
Creatinine, Ser: 4.18 mg/dL — ABNORMAL HIGH (ref 0.44–1.00)
GFR calc non Af Amer: 10 mL/min — ABNORMAL LOW (ref >60)
GFR, EST AFRICAN AMERICAN: 12 mL/min — AB (ref >60)
Glucose, Bld: 87 mg/dL (ref 70–99)
Potassium: 3.5 mmol/L (ref 3.5–5.1)
Sodium: 141 mmol/L (ref 135–145)

## 2017-12-04 LAB — GRAM STAIN

## 2017-12-04 LAB — CBC
HCT: 31.5 % — ABNORMAL LOW (ref 36.0–46.0)
Hemoglobin: 9.4 g/dL — ABNORMAL LOW (ref 12.0–15.0)
MCH: 28.8 pg (ref 26.0–34.0)
MCHC: 29.8 g/dL — ABNORMAL LOW (ref 30.0–36.0)
MCV: 96.6 fL (ref 80.0–100.0)
NRBC: 0 % (ref 0.0–0.2)
Platelets: 237 10*3/uL (ref 150–400)
RBC: 3.26 MIL/uL — AB (ref 3.87–5.11)
RDW: 15.7 % — ABNORMAL HIGH (ref 11.5–15.5)
WBC: 8.2 10*3/uL (ref 4.0–10.5)

## 2017-12-04 LAB — GLUCOSE, PLEURAL OR PERITONEAL FLUID: Glucose, Fluid: <20 mg/dL

## 2017-12-04 LAB — ALBUMIN, PLEURAL OR PERITONEAL FLUID: Albumin, Fluid: 2.2 g/dL

## 2017-12-04 LAB — GLUCOSE, CAPILLARY
Glucose-Capillary: 140 mg/dL — ABNORMAL HIGH (ref 70–99)
Glucose-Capillary: 172 mg/dL — ABNORMAL HIGH (ref 70–99)
Glucose-Capillary: 71 mg/dL (ref 70–99)
Glucose-Capillary: 74 mg/dL (ref 70–99)
Glucose-Capillary: 79 mg/dL (ref 70–99)
Glucose-Capillary: 79 mg/dL (ref 70–99)

## 2017-12-04 LAB — LACTATE DEHYDROGENASE, PLEURAL OR PERITONEAL FLUID: LD FL: 698 U/L — AB (ref 3–23)

## 2017-12-04 MED ORDER — RESOURCE THICKENUP CLEAR PO POWD
ORAL | Status: DC | PRN
  Filled 2017-12-04: qty 125

## 2017-12-04 MED ORDER — INSULIN ASPART 100 UNIT/ML ~~LOC~~ SOLN: 0.0000 [IU] | Freq: Three times a day (TID) | SUBCUTANEOUS | Status: DC

## 2017-12-04 MED ORDER — LIDOCAINE HCL 1 % IJ SOLN
INTRAMUSCULAR | Status: AC
  Filled 2017-12-04: qty 20

## 2017-12-04 MED ORDER — VANCOMYCIN HCL IN DEXTROSE 750-5 MG/150ML-% IV SOLN
750.0000 mg | INTRAVENOUS | Status: DC
  Filled 2017-12-04: qty 150

## 2017-12-04 MED ORDER — VANCOMYCIN HCL IN DEXTROSE 750-5 MG/150ML-% IV SOLN: 750.0000 mg | INTRAVENOUS | Status: DC

## 2017-12-04 MED ORDER — LIDOCAINE HCL 1 % IJ SOLN
INTRAMUSCULAR | Status: DC | PRN
  Administered 2017-12-04: 10 mL

## 2017-12-04 MED ORDER — IPRATROPIUM-ALBUTEROL 0.5-2.5 (3) MG/3ML IN SOLN: 3.0000 mL | Freq: Four times a day (QID) | RESPIRATORY_TRACT | Status: DC | PRN

## 2017-12-04 MED ORDER — VANCOMYCIN HCL IN DEXTROSE 750-5 MG/150ML-% IV SOLN
750.0000 mg | INTRAVENOUS | Status: AC
  Administered 2017-12-05: 750 mg via INTRAVENOUS
  Filled 2017-12-04: qty 150

## 2017-12-04 MED ORDER — PIPERACILLIN-TAZOBACTAM 3.375 G IVPB
3.3750 g | Freq: Two times a day (BID) | INTRAVENOUS | Status: DC
  Administered 2017-12-04 – 2017-12-05 (×2): 3.375 g via INTRAVENOUS
  Filled 2017-12-04 (×4): qty 50

## 2017-12-04 MED ORDER — SODIUM CHLORIDE 0.9 % IV SOLN
2.0000 g | INTRAVENOUS | Status: DC
  Filled 2017-12-04: qty 2

## 2017-12-04 NOTE — Progress Notes (Signed)
Patient ID: Bridget Manning, female   DOB: May 04, 1945, 72 y.o.   MRN: [id number] [facility] Progress Note   Assessment/ Plan:   1.  Healthcare associated pneumonia: With likely pneumonia and parapneumonic effusion versus pleural effusion from volume overload/ESRD-on broad-spectrum antimicrobial therapy and underwent right side thoracentesis this morning yielding only 400 cc of transudative appearing fluid. 2. ESRD: She we will get hemodialysis today to continue her usual Monday/Wednesday/Friday schedule-volume status improving. 3. 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 5. Nutrition: She appears to have moderate protein calorie malnutrition and we will continue current renal diet with multivitamin and oral nutritional supplements. 6. Hypertension: Blood pressure on lower side, continue monitoring with dialysis/ultrafiltration.  Subjective:   Reports to be feeling better this morning-returned from right thoracentesis this morning.   Objective:   BP (!) 85/66 (BP Location: Right Arm) Comment: post-thoracentesis   Pulse 69   Temp 98.3 F (36.8 C) (Oral)   Resp 20   Wt 63.9 kg   SpO2 94%   BMI 24.95 kg/m   Physical Exam: Gen: Appears comfortable resting in bed, engaging in conversation. CVS: Pulse regular rhythm, normal rate, S1 and S2 normal Resp: Diminished breath sounds right side, no rhonchi/rales audible today Abd: Soft, obese, nontender Ext: No lower extremity edema, left radiocephalic fistula with dressing  Labs: BMET Recent Labs  Lab 12/02/17 1307 12/02/17 1315 12/03/17 0557 12/04/17 0805  NA 142 142 143 141  K 4.8 4.5 5.4* 3.5  CL 99 104 101 99  CO2 26  --  22 23  GLUCOSE 88 96 97 87  BUN 68* 69* 80* 31*  CREATININE 7.60* 7.80* 7.93* 4.18*  CALCIUM 8.7*  --  8.5* 8.7*  PHOS  --   --  6.1*  --    CBC Recent Labs  Lab  12/02/17 1307 12/02/17 1315 12/03/17 0557 12/04/17 0805  WBC 10.5  --  8.8 8.2  NEUTROABS 8.7*  --  7.6  --   HGB 9.8* 9.5* 9.8* 9.4*  HCT 35.2* 28.0* 34.2* 31.5*  MCV 102.3*  --  100.0 96.6  PLT 234  --  228 237   Medications:    . aspirin  81 mg Oral Daily  . atorvastatin  40 mg Oral QHS  . chlorhexidine  15 mL Mouth Rinse BID  . Chlorhexidine Gluconate Cloth  6 each Topical Q0600  . doxercalciferol  1 mcg Intravenous Q M,W,F-HD  . heparin injection (subcutaneous)  5,000 Units Subcutaneous Q8H  . insulin aspart  0-9 Units Subcutaneous Q4H  . ipratropium-albuterol  3 mL Nebulization TID  . lidocaine      . mouth rinse  15 mL Mouth Rinse q12n4p  . midodrine  10 mg Oral Q M,W,F  . sertraline  50 mg Oral Daily  . sevelamer carbonate  2,400 mg Oral TID WC  . vancomycin variable dose per unstable renal function (pharmacist dosing)   Does not apply See admin instructions   Zetta BillsJay Mohd Clemons, MD 12/04/2017, 10:28 AM

## 2017-12-04 NOTE — Procedures (Signed)
PROCEDURE SUMMARY:  Successful US guided right thoracentesis. Yielded 400 mL of clear yellow fluid. Patient tolerated procedure well. No immediate complications. EBL < 5 mL  Specimen was sent for labs.  Post procedure chest X-ray reveals no pneumothorax  Devonna Oboyle S Nashika Coker PA-C 12/04/2017 10:16 AM

## 2017-12-04 NOTE — Progress Notes (Signed)
Pharmacy Antibiotic Note  Bridget Manning is a [age] y.o. female admitted on 12/02/2017 with sepsis likely 2/2 to PNA. Patient has ESRD on HD MWF. Pharmacy has been consulted for vancomycin and cefepime dosing.  Patient had been off HD schedule due to Thanksgiving holiday. Per Renal, pt will get HD today to get back on MWF schedule. Patient received Vancomycin 1g yesterday during last hour of HD. She remains afebrile and WBC is wnl. 1/2 blood cultures positive for coag negative staph likely a contaminant. Underwent R-side thoracentesis today and fluid was sent for culture. Will adjust vancomycin and cefepime doses for HD days now that normal schedule has been resumed.  Plan: Change Cefepime to 2g IV QHD Change Vancomycin to 750mg  IV QHD Monitor clinical picture, renal function, VT prn F/U C&S, abx deescalation / LOT  Weight: 140 lb 14 oz (63.9 kg) Temp (24hrs), Avg:98.3 F (36.8 C), Min:97.7 F (36.5 C), Max:99.1 F (37.3 C)  Recent Labs  Lab 12/02/17 1307 12/02/17 1315 12/03/17 0557 12/04/17 0805  WBC 10.5  --  8.8 8.2  CREATININE 7.60* 7.80* 7.93* 4.18*  LATICACIDVEN  --  0.44*  --   --     CrCl cannot be calculated (Unknown ideal weight.).    Allergies  Allergen Reactions  . 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 Latex Itching and Other (See Comments)    Causes "irritation" at site touched by latex (especially "powdered gloves")    Antimicrobials This Admission: Vancomycin 12/2>> Cefepime 12/2 >> Flagyl 12/2>>   Microbiology Results 12/2 Bcx: coag neg staph x1 bottle 12/3 MRSA PCR positive 12/4 Pleural fluid: in process  Thank you for allowing pharmacy to be a part of this patient's care.  [name], PharmD PGY1 Pharmacy Resident Phone [phone] 12/04/2017     11:16 AM

## 2017-12-04 NOTE — Progress Notes (Signed)
Gordy Levanenato Augustin, RN called made aware that the patient would be dialyzed early Thursday morning on 12/05/17.

## 2017-12-04 NOTE — Clinical Social Work Note (Signed)
Clinical Social Work Assessment  Patient Details  Name: Bridget Manning MRN: [id number] Date of Birth: 31-Oct-1945  Date of referral:  12/04/17               Reason for consult:  Discharge Planning                Permission sought to share information with:  Case Manager, Facility Medical sales representativeContact Representative Permission granted to share information::  Yes, Verbal Permission Granted  Name::     Bridget Manning  Agency::  SNFs  Relationship::  sister  Contact Information:  [phone]  Housing/Transportation Living arrangements for the past 2 months:  Skilled Nursing Facility Source of Information:  Siblings Patient Interpreter Needed:  None Criminal Activity/Legal Involvement Pertinent to Current Situation/Hospitalization:  No - Comment as needed Significant Relationships:  Siblings Lives with:  Facility Resident Do you feel safe going back to the place where you live?  Yes Need for family participation in patient care:  Yes (Comment)  Care giving concerns: CSW received referral for possible SNF placement at time of discharge. Spoke with [name] who lives in [place] regarding possibility of SNF placement . [name]   is currently unable to care for her at their home given patient's current needs and fall risk. [name] is currently researching possible transportation routes so patient can go to SNF closer to [name].  [name] expressed understanding of PT recommendation and are agreeable to SNF placement at time of discharge. CSW to continue to follow and assist with discharge planning needs.     Social Worker assessment / plan:  Spoke with [name] concerning [facility] before [facility].    Employment status:  Retired Health and safety inspectornsurance information:  Medicare PT Recommendations:  Skilled Nursing Facility Information / Referral to community resources:  Skilled Nursing Facility  Patient/Family's Response to care:   [name]  recognizes need for rehab before returning home and are agreeable to a SNF in [place]. They report preference for  Returning back to [facility] where patient has been staying. CSW explained insurance authorization process. Patient's family reported that they want patient to get stronger to be able to come back home.    Patient/Family's Understanding of and Emotional Response to Diagnosis, Current Treatment, and Prognosis:  Patient/family is realistic regarding therapy needs and expressed being hopeful for SNF placement. Patient family expressed understanding of CSW role and discharge process as well as medical condition. No questions/concerns about plan or treatment.    Emotional Assessment Appearance:  Appears stated age Attitude/Demeanor/Rapport:  Unable to Assess Affect (typically observed):  Unable to Assess Orientation:  Oriented to Self Alcohol / Substance use:  Not Applicable Psych involvement (Current and /or in the community):  No (Comment)  Discharge Needs  Concerns to be addressed:  Discharge Planning Concerns Readmission within the last 30 days:  No Current discharge risk:  Dependent with Mobility Barriers to Discharge:  Continued Medical Work up   Dynegyshley M Binta Statzer, LCSW 12/04/2017, 2:56 PM

## 2017-12-04 NOTE — Evaluation (Signed)
Physical Therapy Evaluation Patient Details Name: Bridget Manning MRN: [id number] DOB: 10/02/1945 Today's Date: 12/04/2017   History of Present Illness  72yo female [facility] from SNF due to increased weakness and fever, confusion. Hypoxic and hypotensive in the ED. Chest x-ray shows complete blackout R lung. Admitted for acute respiratory failure wtih hypoxemia. PMH DM, CVA   Clinical Impression   RN confirms patient is to have procedure but reports patient is OK for therapy to see this morning. Patient received in bed with R trunk lean, A&Ox1 to self only but willing to participate in therapy session this morning. Of note, she demonstrates severe bilateral foot and ankle contractures with all toes in flexion, and ankles plantar flexed and inverted; R hand appears WNL however L wrist appears contracted into flexion, L finger PIPs in extension, and L finger DIPs in flexion contracture. Suspect she is likely bed bound/non-ambulatory at her SNF. Also noted small areas of skin breakdown on top of toes, possible pressure injury from blanket, and suspicious area on R heel. SPO2 93% on room air at rest, HR 68 BPM. She required MaxA for supine to sit and demonstrated poor balance at EOB requiring Mod-MaxA to maintain upright balance, returned to supine after 2-3 minutes due to patient reports of dizziness however with return to supine SpO2 92% on room air, BP 122/79. She was positioned to comfort with cushion between head/shoulder and R bed rail and heels floated, toes uncovered, nurse tech aware of positioning recommendations. She may benefit from trial of skilled PT services in the acute setting, recommend return to SNF when medically appropriate for DC.      Follow Up Recommendations SNF    Equipment Recommendations  Other (comment)(defer to next venue )    Recommendations for Other Services       Precautions / Restrictions Precautions Precautions: Fall Restrictions Weight Bearing  Restrictions: No      Mobility  Bed Mobility Overal bed mobility: Needs Assistance Bed Mobility: Supine to Sit;Sit to Supine     Supine to sit: Max assist Sit to supine: Max assist   General bed mobility comments: MaxA for all functional bed mobility today; once sitting at EOB demonstrated poor balance requiring Min-ModA to maintain upright. Returned to supine with maxA due to patient reported dizziness, however SpO2 92% on room air and BP 122/79.   Transfers                 General transfer comment: deferred, will require +2 assist for safety   Ambulation/Gait             General Gait Details: deferred  Stairs            Wheelchair Mobility    Modified Rankin (Stroke Patients Only)       Balance Overall balance assessment: Needs assistance Sitting-balance support: Bilateral upper extremity supported;Feet supported Sitting balance-Leahy Scale: Poor   Postural control: Posterior lean   Standing balance-Leahy Scale: Zero                               Pertinent Vitals/Pain Pain Assessment: Faces Pain Score: 0-No pain Faces Pain Scale: No hurt Pain Intervention(s): Limited activity within patient's tolerance;Monitored during session    Home Living Family/patient expects to be discharged to:: Skilled nursing facility                 Additional Comments: patient A&Ox1, unable to provide  information and family not present to assist     Prior Function           Comments: patient A&Ox1, unable to provide information, from physical presentation appears likely bed bound at baseline      Hand Dominance        Extremity/Trunk Assessment   Upper Extremity Assessment Upper Extremity Assessment: Defer to OT evaluation    Lower Extremity Assessment Lower Extremity Assessment: Generalized weakness    Cervical / Trunk Assessment Cervical / Trunk Assessment: Kyphotic  Communication   Communication: No difficulties   Cognition Arousal/Alertness: Lethargic Behavior During Therapy: Flat affect Overall Cognitive Status: Impaired/Different from baseline Area of Impairment: Orientation;Attention;Memory;Following commands;Safety/judgement;Awareness;Problem solving                 Orientation Level: Disoriented to;Place;Time;Situation Current Attention Level: Focused Memory: Decreased short-term memory Following Commands: Follows one step commands inconsistently;Follows one step commands with increased time Safety/Judgement: Decreased awareness of deficits;Decreased awareness of safety Awareness: Intellectual Problem Solving: Slow processing;Decreased initiation;Difficulty sequencing;Requires verbal cues;Requires tactile cues General Comments: A&Ox1, briefly wakes to converse with PT but then closes eyes and stops talking in middle of sentences      General Comments      Exercises     Assessment/Plan    PT Assessment Patient needs continued PT services  PT Problem List Decreased strength;Decreased cognition;Decreased knowledge of use of DME;Decreased activity tolerance;Decreased safety awareness;Decreased balance;Decreased mobility;Decreased coordination       PT Treatment Interventions DME instruction;Balance training;Neuromuscular re-education;Functional mobility training;Patient/family education;Therapeutic activities;Therapeutic exercise;Manual techniques;Gait training;Stair training    PT Goals (Current goals can be found in the Care Plan section)  Acute Rehab PT Goals PT Goal Formulation: Patient unable to participate in goal setting    Frequency Min 2X/week   Barriers to discharge        Co-evaluation               AM-PAC PT "6 Clicks" Mobility  Outcome Measure Help needed turning from your back to your side while in a flat bed without using bedrails?: Total Help needed moving from lying on your back to sitting on the side of a flat bed without using bedrails?:  Total Help needed moving to and from a bed to a chair (including a wheelchair)?: Total Help needed standing up from a chair using your arms (e.g., wheelchair or bedside chair)?: Total Help needed to walk in hospital room?: Total Help needed climbing 3-5 steps with a railing? : Total 6 Click Score: 6    End of Session   Activity Tolerance: Patient tolerated treatment well Patient left: in bed;with call bell/phone within reach;with bed alarm set   PT Visit Diagnosis: Muscle weakness (generalized) (M62.81);Other abnormalities of gait and mobility (R26.89)    Time: 0950-1006 PT Time Calculation (min) (ACUTE ONLY): 16 min   Charges:   PT Evaluation $PT Eval Moderate Complexity: 1 Mod         Nedra Hai PT, DPT, CBIS  Supplemental Physical Therapist [place]    Pager [phone] Acute Rehab Office [phone]

## 2017-12-04 NOTE — Progress Notes (Signed)
PROGRESS NOTE    Bridget Manning  ZOX:[id number] DOB: 12-21-1945 DOA: 12/02/2017 PCP: Rogers Seeds, MD     Brief Narrative:  Bridget Manning a 72 y.o.femalewithpastmedical history significant forformertobacco user, end-stage renal disease on dialysis Monday Wednesday Friday, type 2 diabetes, chronic hypotension on midodrine,who [facility] being found with worsening weakness and a fever of 100.8. Large right pleural effusion found.   New events last 24 hours / Subjective: Patient underwent right-sided thoracentesis this morning.  Patient denies any shortness of breath today, states that it has improved after thoracentesis episode.  Denies any chest pain.  Assessment & Plan:   Active Problems:   Acute respiratory failure with hypoxia (HCC)   Pressure injury of skin   Acute respiratory failure with hypoxia secondary to large right pleural effusion, HCAP, ?aspiration  -S/p right thoracentesis -Continue to wean [place] O2 to room air   -Continue vanco, change to zosyn to cover aspiration   Coag neg staph bacteremia -Likely contaminant in 1 of 2 set of blood cultures   Acute metabolic encephalopathy suspect secondary to hypoxia versus possible infective process versus others -Per her sister,at her baseline she is more alert with no prior history of dementia that she knows of  Chronic hypotension -Continue midodrine   End-stage renal disease on hemodialysis Monday Wednesday Friday -Per Nephrology   Anemia of chronic disease secondary to end-stage renal disease -Monitor  Type 2 diabetes with complication including ESRD  -A1c 5.2 -Novolog SSI  CAD -Continue aspirin, lipitor   Dysphagia -SLP eval  Depression -Continue zoloft   DVT prophylaxis: Subq hep Code Status: DNR Family Communication: No family at bedside Disposition Plan: Pending stabilization in oxygenation after thoracentesis. Will need SNF on discharge    Consultants:    Nephrology  IR   Procedures:   Thoracentesis 12/4   Antimicrobials:  Anti-infectives (From admission, onward)   Start     Dose/Rate Route Frequency Ordered Stop   12/04/17 1800  ceFEPIme (MAXIPIME) 2 g in sodium chloride 0.9 % 100 mL IVPB  Status:  Discontinued     2 g 200 mL/hr over 30 Minutes Intravenous Every M-W-F (1800) 12/04/17 1109 12/04/17 1702   12/04/17 1715  piperacillin-tazobactam (ZOSYN) IVPB 3.375 g     3.375 g 12.5 mL/hr over 240 Minutes Intravenous Every 8 hours 12/04/17 1702     12/04/17 1200  vancomycin (VANCOCIN) IVPB 750 mg/150 ml premix     750 mg 150 mL/hr over 60 Minutes Intravenous Every M-W-F (Hemodialysis) 12/04/17 1109     12/03/17 1800  ceFEPIme (MAXIPIME) 1 g in sodium chloride 0.9 % 100 mL IVPB  Status:  Discontinued     1 g 200 mL/hr over 30 Minutes Intravenous Every 24 hours 12/02/17 1410 12/04/17 1104   12/03/17 1200  vancomycin (VANCOCIN) IVPB 1000 mg/200 mL premix  Status:  Discontinued     1,000 mg 200 mL/hr over 60 Minutes Intravenous Every Tue (Hemodialysis) 12/03/17 0827 12/04/17 1104   12/03/17 0933  vancomycin (VANCOCIN) 1-5 GM/200ML-% IVPB    Note to Pharmacy:  Verna Czech   : cabinet override      12/03/17 0933 12/03/17 2144   12/03/17 0200  metroNIDAZOLE (FLAGYL) IVPB 500 mg  Status:  Discontinued     500 mg 100 mL/hr over 60 Minutes Intravenous Every 8 hours 12/03/17 0033 12/04/17 1702   12/02/17 2118  vancomycin variable dose per unstable renal function (pharmacist dosing)  Status:  Discontinued      Does  not apply See admin instructions 12/02/17 2119 12/04/17 1109   12/02/17 1300  ceFEPIme (MAXIPIME) 2 g in sodium chloride 0.9 % 100 mL IVPB     2 g 200 mL/hr over 30 Minutes Intravenous  Once 12/02/17 1255 12/02/17 1515   12/02/17 1300  metroNIDAZOLE (FLAGYL) IVPB 500 mg  Status:  Discontinued     500 mg 100 mL/hr over 60 Minutes Intravenous Every 8 hours 12/02/17 1255 12/03/17 0033   12/02/17 1300  vancomycin (VANCOCIN)  IVPB 1000 mg/200 mL premix     1,000 mg 200 mL/hr over 60 Minutes Intravenous  Once 12/02/17 1255 12/02/17 1702       Objective: Vitals:   12/04/17 0915 12/04/17 1000 12/04/17 1219 12/04/17 1632  BP: (!) 85/66 (!) 129/56 (!) 111/44 (!) 144/75  Pulse:  68 68 67  Resp:  20 19 20   Temp:  98.5 F (36.9 C)  98.2 F (36.8 C)  TempSrc:  Oral  Oral  SpO2:  92% (!) 88% 96%  Weight:        Intake/Output Summary (Last 24 hours) at 12/04/2017 1705 Last data filed at 12/04/2017 1500 Gross per 24 hour  Intake 825.22 ml  Output 400 ml  Net 425.22 ml   Filed Weights   12/03/17 0715 12/03/17 1125 12/03/17 2009  Weight: 65.7 kg 63.6 kg 63.9 kg    Examination:  General exam: Appears calm  Respiratory system: Diminished. Respiratory effort normal. Cardiovascular system: S1 & S2 heard, RRR. No JVD, murmurs, rubs, gallops or clicks. No pedal edema. Gastrointestinal system: Abdomen is nondistended, soft and nontender. No organomegaly or masses felt. Normal bowel sounds heard. Central nervous system: Alert and oriented  Skin: No rashes, lesions or ulcers Psychiatry: Judgement and insight appear stable   Data Reviewed: I have personally reviewed following labs and imaging studies  CBC: Recent Labs  Lab 12/02/17 1307 12/02/17 1315 12/03/17 0557 12/04/17 0805  WBC 10.5  --  8.8 8.2  NEUTROABS 8.7*  --  7.6  --   HGB 9.8* 9.5* 9.8* 9.4*  HCT 35.2* 28.0* 34.2* 31.5*  MCV 102.3*  --  100.0 96.6  PLT 234  --  228 237   Basic Metabolic Panel: Recent Labs  Lab 12/02/17 1307 12/02/17 1315 12/03/17 0557 12/04/17 0805  NA 142 142 143 141  K 4.8 4.5 5.4* 3.5  CL 99 104 101 99  CO2 26  --  22 23  GLUCOSE 88 96 97 87  BUN 68* 69* 80* 31*  CREATININE 7.60* 7.80* 7.93* 4.18*  CALCIUM 8.7*  --  8.5* 8.7*  MG  --   --  2.4  --   PHOS  --   --  6.1*  --    GFR: CrCl cannot be calculated (Unknown ideal weight.). Liver Function Tests: Recent Labs  Lab 12/02/17 1307  AST 19  ALT 11   ALKPHOS 95  BILITOT 1.5*  PROT 6.6  ALBUMIN 2.8*   No results for input(s): LIPASE, AMYLASE in the last 168 hours. Recent Labs  Lab 12/02/17 1307  AMMONIA 11   Coagulation Profile: No results for input(s): INR, PROTIME in the last 168 hours. Cardiac Enzymes: No results for input(s): CKTOTAL, CKMB, CKMBINDEX, TROPONINI in the last 168 hours. BNP (last 3 results) No results for input(s): PROBNP in the last 8760 hours. HbA1C: Recent Labs    12/02/17 2100  HGBA1C 5.2   CBG: Recent Labs  Lab 12/04/17 0008 12/04/17 0421 12/04/17 0741 12/04/17 1230 12/04/17 1635  GLUCAP 74 79 71 172* 79   Lipid Profile: No results for input(s): CHOL, HDL, LDLCALC, TRIG, CHOLHDL, LDLDIRECT in the last 72 hours. Thyroid Function Tests: No results for input(s): TSH, T4TOTAL, FREET4, T3FREE, THYROIDAB in the last 72 hours. Anemia Panel: Recent Labs    12/02/17 2100  FERRITIN 2,496*  TIBC 112*  IRON 25*   Sepsis Labs: Recent Labs  Lab 12/02/17 1315 12/02/17 2100  PROCALCITON  --  14.70  LATICACIDVEN 0.44*  --     Recent Results (from the past 240 hour(s))  Blood culture (routine x 2)     Status: None (Preliminary result)   Collection Time: 12/02/17  1:56 PM  Result Value Ref Range Status   Specimen Description BLOOD RIGHT HAND  Final   Special Requests   Final    BOTTLES DRAWN AEROBIC AND ANAEROBIC Blood Culture results may not be optimal due to an inadequate volume of blood received in culture bottles   Culture   Final    NO GROWTH 2 DAYS Performed at [facility], [address], [place], [place] [address]    Report Status PENDING  Incomplete  Blood culture (routine x 2)     Status: Abnormal (Preliminary result)   Collection Time: 12/02/17  1:56 PM  Result Value Ref Range Status   Specimen Description BLOOD SITE NOT SPECIFIED  Final   Special Requests   Final    BOTTLES DRAWN AEROBIC AND ANAEROBIC Blood Culture adequate volume   Culture  Setup Time   Final     GRAM POSITIVE COCCI IN BOTH AEROBIC AND ANAEROBIC BOTTLES CRITICAL RESULT CALLED TO, READ BACK BY AND VERIFIED WITH: PHARMD M MACCIA [id number] MLM    Culture (A)  Final    STAPHYLOCOCCUS SPECIES (COAGULASE NEGATIVE) THE SIGNIFICANCE OF ISOLATING THIS ORGANISM FROM A SINGLE SET OF BLOOD CULTURES WHEN MULTIPLE SETS ARE DRAWN IS UNCERTAIN. PLEASE NOTIFY THE MICROBIOLOGY DEPARTMENT WITHIN ONE WEEK IF SPECIATION AND SENSITIVITIES ARE REQUIRED. Performed at [facility], [address] [place], [place] [address]    Report Status PENDING  Incomplete  Blood Culture ID Panel (Reflexed)     Status: Abnormal   Collection Time: 12/02/17  1:56 PM  Result Value Ref Range Status   Enterococcus species NOT DETECTED NOT DETECTED Final   Listeria monocytogenes NOT DETECTED NOT DETECTED Final   Staphylococcus species DETECTED (A) NOT DETECTED Final    Comment: Methicillin (oxacillin) resistant coagulase negative staphylococcus. Possible blood culture contaminant (unless isolated from more than one blood culture draw or clinical case suggests pathogenicity). No antibiotic treatment is indicated for blood  culture contaminants. CRITICAL RESULT CALLED TO, READ BACK BY AND VERIFIED WITH: PHARMD M MACCIA [id number] 0910 MLM    Staphylococcus aureus (BCID) NOT DETECTED NOT DETECTED Final   Methicillin resistance DETECTED (A) NOT DETECTED Final    Comment: CRITICAL RESULT CALLED TO, READ BACK BY AND VERIFIED WITH: PHARMD M MACCIA [id number] 0910 MLM    Streptococcus species NOT DETECTED NOT DETECTED Final   Streptococcus agalactiae NOT DETECTED NOT DETECTED Final   Streptococcus pneumoniae NOT DETECTED NOT DETECTED Final   Streptococcus pyogenes NOT DETECTED NOT DETECTED Final   Acinetobacter baumannii NOT DETECTED NOT DETECTED Final   Enterobacteriaceae species NOT DETECTED NOT DETECTED Final   Enterobacter cloacae complex NOT DETECTED NOT DETECTED Final   Escherichia coli NOT DETECTED NOT DETECTED Final    Klebsiella oxytoca NOT DETECTED NOT DETECTED Final   Klebsiella pneumoniae NOT DETECTED NOT  DETECTED Final   Proteus species NOT DETECTED NOT DETECTED Final   Serratia marcescens NOT DETECTED NOT DETECTED Final   Haemophilus influenzae NOT DETECTED NOT DETECTED Final   Neisseria meningitidis NOT DETECTED NOT DETECTED Final   Pseudomonas aeruginosa NOT DETECTED NOT DETECTED Final   Candida albicans NOT DETECTED NOT DETECTED Final   Candida glabrata NOT DETECTED NOT DETECTED Final   Candida krusei NOT DETECTED NOT DETECTED Final   Candida parapsilosis NOT DETECTED NOT DETECTED Final   Candida tropicalis NOT DETECTED NOT DETECTED Final    Comment: Performed at [facility], [address] [place], [place] [address]  MRSA PCR Screening     Status: Abnormal   Collection Time: 12/03/17  4:38 PM  Result Value Ref Range Status   MRSA by PCR POSITIVE (A) NEGATIVE Final    Comment:        The GeneXpert MRSA Assay (FDA approved for NASAL specimens only), is one component of a comprehensive MRSA colonization surveillance program. It is not intended to diagnose MRSA infection nor to guide or monitor treatment for MRSA infections. CRITICAL RESULT CALLED TO, READ BACK BY AND VERIFIED WITH: Lyndle Herrlich, RN AT 1820 ON 12/03/17 BY C. JESSUP, MLT. Performed at [facility], [address] [place], [place] [address]   Gram stain     Status: None   Collection Time: 12/04/17 10:56 AM  Result Value Ref Range Status   Specimen Description PLEURAL RIGHT  Final   Special Requests NONE  Final   Gram Stain   Final    RARE WBC PRESENT, PREDOMINANTLY MONONUCLEAR NO ORGANISMS SEEN Performed at [facility], [address] [place], [place] [address]    Report Status 12/04/2017 FINAL  Final       Radiology Studies: Dg Chest 1 View  Result Date: 12/04/2017 CLINICAL DATA:  Post RIGHT thoracentesis EXAM: CHEST  1 VIEW COMPARISON:  CT chest 12/02/2017. Chest x-ray 12/02/2017 and  earlier. FINDINGS: Interval reduction in size of the RIGHT pleural effusion post thoracentesis, though a moderate-sized effusion persists. No pneumothorax. Improved aeration in the RIGHT lung, with atelectasis persisting at the RIGHT lung base. LEFT lung remains clear. Mild pulmonary venous hypertension without overt edema, unchanged over multiple prior examinations. Cardiac silhouette moderately enlarged, unchanged. Thoracic aorta atherosclerotic, unchanged. LEFT subclavian artery stent again noted. IMPRESSION: 1. Reduction in size of the RIGHT pleural effusion post thoracentesis, though a moderate sized effusion persists. 2. No pneumothorax. 3. Improved aeration in the RIGHT lung with atelectasis persisting at the RIGHT base. 4. No new abnormalities. Electronically Signed   By: Hulan Saas M.D.   On: 12/04/2017 09:56   Ct Chest Wo Contrast  Result Date: 12/02/2017 CLINICAL DATA:  Differentiated sepsis with septic shock EXAM: CT CHEST WITHOUT CONTRAST TECHNIQUE: Multidetector CT imaging of the chest was performed following the standard protocol without IV contrast. COMPARISON:  03/14/2017 chest CT, same day CXR FINDINGS: Cardiovascular: Top-normal heart size with coronary arteriosclerosis. No pericardial effusion or thickening. Atherosclerosis of the thoracic aorta and branch vessels with stent in the proximal left subclavian artery. The unenhanced pulmonary vasculature is unremarkable. Mediastinum/Nodes: Assessment for hilar adenopathy is limited due to lack of IV contrast. 15 mm short axis right paratracheal lymph node is identified versus 9 mm previously. Small prevascular lymph nodes are noted subcentimeter mass in dimension. Calcified mildly enlarged thyroid gland consistent with multinodular goiter. Trachea is patent with slight luminal narrowing of the right mainstem bronchus. Lungs/Pleura: Interval increase  in right-sided pleural effusion now moderate to large with areas of loculation along the  anterior aspect. Adjacent compressive atelectasis and consolidation due to the fusion is identified with only minimal aeration of right upper lobe currently seen. Soft tissue debris noted within middle and right lower lobe bronchi likely contributing to the areas of atelectasis and consolidation. Trace atelectasis at the left lung base. Upper Abdomen: Redemonstration of hypodense suprarenal mass measuring up to 4.2 cm may reflect an adrenal adenoma off the anterior limb. This is relatively stable in appearance. Otherwise, unremarkable upper abdomen. Musculoskeletal: No acute osseous abnormality. Chronic compression deformities of the thoracolumbar spine at T6, T7, T11 through L1, greatest at L1. IMPRESSION: 1. Interval increase in moderate to large right pleural effusion with compressive atelectasis and pulmonary consolidation with minimal aeration of the right upper lobe now noted. 2. Soft tissue debris/mucous impaction within right middle and lower lobe bronchi likely contributing to postobstructive consolidation and atelectasis. 3. Trace left effusion with atelectasis. 4. Coronary arteriosclerosis and aortic atherosclerosis. Findings are stable in appearance. Stent in the left proximal subclavian artery. 5. Stable left adrenal mass compatible with adenoma. 6. Chronic midthoracic the thoracolumbar compression fractures. Aortic atherosclerosis Electronically Signed   By: Tollie Eth M.D.   On: 12/02/2017 19:21   Dg Swallowing Func-speech Pathology  Result Date: 12/04/2017 Objective Swallowing Evaluation: Type of Study: MBS-Modified Barium Swallow Study  Patient Details Name: Teresea Donley MRN: [id number] Date of Birth: June 05, 1945 Today's Date: 12/04/2017 Time: SLP Start Time (ACUTE ONLY): 1143 -SLP Stop Time (ACUTE ONLY): 1202 SLP Time Calculation (min) (ACUTE ONLY): 19 min Past Medical History: Past Medical History: Diagnosis Date . Diabetes mellitus without complication (HCC)  . Renal disorder  . Stroke (HCC)   . UTI (urinary tract infection)  Past Surgical History: Past Surgical History: Procedure Laterality Date . A/V FISTULAGRAM Left 03/05/2017  Procedure: A/V FISTULAGRAM;  Surgeon: Nada Libman, MD;  Location: [facility] INVASIVE CV LAB;  Service: Cardiovascular;  Laterality: Left; . IR THORACENTESIS ASP PLEURAL SPACE W/IMG GUIDE  12/04/2017 HPI: Clairessa Boulet is a 72 y.o. female with PMH significant for stroke, former tobacco use, ERSD on HD, type 2 DM, chronic hypotension, who presented to [facility] ED from SNF on 12/02/17 after being found with worsening weakness, fever,  AMS and acute respiratory failure with hypoxia- diagnosed with healthcare acquired pneumonia and ESRD. 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  No data recorded Assessment / Plan / Recommendation CHL IP CLINICAL IMPRESSIONS 12/04/2017 Clinical Impression Pt demonstrated mild oral and moderate pharyngeal dysphagia marked by anterior loss and silent aspiration of thin liquids. Pt's swallow initiation consistently occurred at the pyriform sinuses with both thin and nectar liquids, however her delayed initiation resulted in more severe implications with thin as opposed to nectar. Thin liquid entered the vestibule prior to swallow initiation, and was subsequently aspirated during the swallow without any reflexive response. A smaller volume of penetration at and above the vocal folds was observed in 3 out of 6 nectar trials. Pt demonstrates impulsivity evidenced by large sips of liquid, however she was unable to follow commands to take smaller boluses and expectorated all attempts at controlled trials delivered  by the clinician. Pt exhibited a timely and  efficient swallow with puree texture without threat of airway instrusion. Recommend puree texture diet, nectar thick liquids, meds crushed in applesauce, and FULL supervision to remind pt to use slow rate, take small bites and sips to the best of her ability, and perform intermittent cough/throat clear throughout PO intake. ST will continue to follow her in the acute setting to provide skilled dysphagia intervention including education,  safe swallow strategy training, and opportunities for diet/liquid advancement.  SLP Visit Diagnosis Dysphagia, oropharyngeal phase (R13.12) Attention and concentration deficit following -- Frontal lobe and executive function deficit following -- Impact on safety and function Severe aspiration risk   CHL IP TREATMENT RECOMMENDATION 12/04/2017 Treatment Recommendations Therapy as outlined in treatment plan below   Prognosis 12/04/2017 Prognosis for Safe Diet Advancement Fair Barriers to Reach Goals Cognitive deficits Barriers/Prognosis Comment -- CHL IP DIET RECOMMENDATION 12/04/2017 SLP Diet Recommendations Dysphagia 1 (Puree) solids;Nectar thick liquid Liquid Administration via Cup Medication Administration Crushed with puree Compensations Small sips/bites;Slow rate;Minimize environmental distractions;Clear throat intermittently Postural Changes Seated upright at 90 degrees   CHL IP OTHER RECOMMENDATIONS 12/04/2017 Recommended Consults -- Oral Care Recommendations Oral care BID Other Recommendations Order thickener from pharmacy;Remove water pitcher;Have oral suction available   CHL IP FOLLOW UP RECOMMENDATIONS 12/04/2017 Follow up Recommendations Skilled Nursing facility   [facility] IP FREQUENCY AND DURATION 12/04/2017 Speech Therapy Frequency (ACUTE ONLY) min 2x/week Treatment Duration 2 weeks      CHL IP ORAL PHASE 12/04/2017 Oral Phase Impaired Oral - Pudding Teaspoon -- Oral - Pudding Cup -- Oral - Honey Teaspoon -- Oral - Honey Cup -- Oral -  Nectar Teaspoon -- Oral - Nectar Cup Right anterior bolus loss Oral - Nectar Straw -- Oral - Thin Teaspoon -- Oral - Thin Cup Right anterior bolus loss Oral - Thin Straw -- Oral - Puree WFL Oral - Mech Soft -- Oral - Regular -- Oral - Multi-Consistency -- Oral - Pill -- Oral Phase - Comment --  CHL IP PHARYNGEAL PHASE 12/04/2017 Pharyngeal Phase Impaired Pharyngeal- Pudding Teaspoon -- Pharyngeal -- Pharyngeal- Pudding Cup -- Pharyngeal -- Pharyngeal- Honey Teaspoon -- Pharyngeal -- Pharyngeal- Honey Cup -- Pharyngeal -- Pharyngeal- Nectar Teaspoon -- Pharyngeal -- Pharyngeal- Nectar Cup Penetration/Aspiration during swallow;Delayed swallow initiation-pyriform sinuses Pharyngeal Material enters airway, CONTACTS cords and not ejected out;Material enters airway, remains ABOVE vocal cords and not ejected out Pharyngeal- Nectar Straw -- Pharyngeal -- Pharyngeal- Thin Teaspoon -- Pharyngeal -- Pharyngeal- Thin Cup Penetration/Aspiration before swallow;Penetration/Aspiration during swallow;Delayed swallow initiation-pyriform sinuses Pharyngeal Material enters airway, passes BELOW cords without attempt by patient to eject out (silent aspiration);Material enters airway, CONTACTS cords and not ejected out Pharyngeal- Thin Straw -- Pharyngeal -- Pharyngeal- Puree WFL Pharyngeal -- Pharyngeal- Mechanical Soft -- Pharyngeal -- Pharyngeal- Regular -- Pharyngeal -- Pharyngeal- Multi-consistency -- Pharyngeal -- Pharyngeal- Pill -- Pharyngeal -- Pharyngeal Comment --  CHL IP CERVICAL ESOPHAGEAL PHASE 12/04/2017 Cervical Esophageal Phase WFL Pudding Teaspoon -- Pudding Cup -- Honey Teaspoon -- Honey Cup -- Nectar Teaspoon -- Nectar Cup -- Nectar Straw -- Thin Teaspoon -- Thin Cup -- Thin Straw -- Puree -- Mechanical Soft -- Regular -- Multi-consistency -- Pill -- Cervical Esophageal Comment -- Royce Macadamia 12/04/2017, 3:30 PM  Breck Coons [employer] administrator Pager [phone] Office [phone]              Ir Thoracentesis Asp Pleural Space W/img Guide  Result Date: 12/04/2017 INDICATION: End stage renal disease on hemodialysis. Hypoxia. Loculated appearing right pleural effusion. Request for diagnostic  and therapeutic thoracentesis. EXAM: ULTRASOUND GUIDED RIGHT THORACENTESIS MEDICATIONS: 1% lidocaine 10 mL COMPLICATIONS: None immediate. PROCEDURE: An ultrasound guided thoracentesis was thoroughly discussed with the patient and questions answered. The benefits, risks, alternatives and complications were also discussed. The patient understands and wishes to proceed with the procedure. Written consent was obtained. Ultrasound was performed to localize and mark an adequate pocket of fluid in the right chest. The area was then prepped and draped in the normal sterile fashion. 1% Lidocaine was used for local anesthesia. Under ultrasound guidance a 6 Fr Safe-T-Centesis catheter was introduced. Thoracentesis was performed. The catheter was removed and a dressing applied. FINDINGS: A total of approximately 400 mL of clear yellow fluid was removed. Samples were sent to the laboratory as requested by the clinical team. IMPRESSION: Successful ultrasound guided right thoracentesis yielding 400 mL of pleural fluid. No pneumothorax on post-procedure chest x-ray. Read by: Corrin ParkerWendy Blair, PA-C Electronically Signed   By: Simonne ComeJohn  Watts M.D.   On: 12/04/2017 10:14      Scheduled Meds: . aspirin  81 mg Oral Daily  . atorvastatin  40 mg Oral QHS  . chlorhexidine  15 mL Mouth Rinse BID  . Chlorhexidine Gluconate Cloth  6 each Topical Q0600  . doxercalciferol  1 mcg Intravenous Q M,W,F-HD  . heparin injection (subcutaneous)  5,000 Units Subcutaneous Q8H  . [START ON 12/05/2017] insulin aspart  0-9 Units Subcutaneous TID WC  . lidocaine      . mouth rinse  15 mL Mouth Rinse q12n4p  . midodrine  10 mg Oral Q M,W,F  . sertraline  50 mg Oral Daily  . sevelamer carbonate  2,400 mg Oral TID WC   Continuous Infusions: .  piperacillin-tazobactam (ZOSYN)  IV    . sodium chloride    . vancomycin       LOS: 2 days    Time spent: 45 minutes   Noralee StainJennifer Jared Whorley, DO [facility] [email or web address] Password [email or web address] 12/04/2017, 5:05 PM

## 2017-12-04 NOTE — [place] FL2 (Signed)
[place] MEDICAID FL2 LEVEL OF CARE SCREENING TOOL     IDENTIFICATION  Patient Name: Bridget FuseMarion Lamphear Birthdate: Aug 23, 1945 Sex: female Admission Date (Current Location): 12/02/2017  [facility] [place]   Facility and Address:  [facility], [address], [place], [place] [address]      Provider Number: [id number]  Attending Physician Name and Address:  Noralee Stainhoi, Jennifer, DO  Relative Name and Phone Number:  [name] (sister) [phone]    Current Level of Care: Hospital Recommended Level of Care: Skilled Nursing Facility Prior Approval Number:    Date Approved/Denied:   PASRR Number: [id number]  Discharge Plan: SNF    Current Diagnoses: Patient Active Problem List   Diagnosis Date Noted  . Pressure injury of skin 12/03/2017  . Acute respiratory failure with hypoxia (HCC) 12/02/2017  . Septic shock (HCC)   . Hypoglycemia associated with diabetes (HCC) 09/26/2016  . Urinary tract infection 09/26/2016  . Delirium   . Fecal impaction (HCC)   . ESRD (end stage renal disease) on dialysis (HCC) 09/18/2016  . Diabetes mellitus with complication (HCC) 09/18/2016  . History of UTI 09/18/2016  . Hypoglycemia 09/18/2016    Orientation RESPIRATION BLADDER Height & Weight     Self  Normal Incontinent(catheter) Weight: 63.9 kg Height:     BEHAVIORAL SYMPTOMS/MOOD NEUROLOGICAL BOWEL NUTRITION STATUS      Continent Diet(see discharge summary)  AMBULATORY STATUS COMMUNICATION OF NEEDS Skin   Extensive Assist Verbally PU Stage and Appropriate Care(stage 2 pressure injury on buttocks)                       Personal Care Assistance Level of Assistance  Bathing, Feeding, Dressing, Total care Bathing Assistance: Maximum assistance Feeding assistance: Limited assistance Dressing Assistance: Maximum assistance Total Care Assistance: Maximum assistance   Functional Limitations Info  Sight, Hearing, Speech Sight Info:  Adequate Hearing Info: Adequate Speech Info: Adequate    SPECIAL CARE FACTORS FREQUENCY  PT (By licensed PT), OT (By licensed OT)     PT Frequency: min 5x weekly OT Frequency: min 3x weekly            Contractures Contractures Info: Not present    Additional Factors Info  Code Status, Allergies Code Status Info: DNR Allergies Info: Allergies:  Novocain Procaine, Latex           Current Medications (12/04/2017):  This is the current hospital active medication list Current Facility-Administered Medications  Medication Dose Route Frequency Provider Last Rate Last Dose  . aspirin chewable tablet 81 mg  81 mg Oral Daily Dow AdolphHall, Carole N, DO   81 mg at 12/04/17 1042  . atorvastatin (LIPITOR) tablet 40 mg  40 mg Oral QHS Hall, Carole N, DO      . ceFEPIme (MAXIPIME) 2 g in sodium chloride 0.9 % 100 mL IVPB  2 g Intravenous Q M,W,F-1800 Lore, Melissa A, RPH      . chlorhexidine (PERIDEX) 0.12 % solution 15 mL  15 mL Mouth Rinse BID Hall, Carole N, DO   15 mL at 12/04/17 1041  . Chlorhexidine Gluconate Cloth 2 % PADS 6 each  6 each Topical Q0600 Ethelene HalLin, James W, MD   6 each at 12/04/17 [phone]  . doxercalciferol (HECTOROL) injection 1 mcg  1 mcg Intravenous Q M,W,F-HD Ethelene HalLin, James W, MD      . heparin injection 5,000 Units  5,000 Units Subcutaneous [address] Hall, Carole N, DO   5,000 Units  at 12/04/17 1241  . insulin aspart (novoLOG) injection 0-9 Units  0-9 Units Subcutaneous Q4H Darlin Drop, DO   2 Units at 12/04/17 1239  . ipratropium-albuterol (DUONEB) 0.5-2.5 (3) MG/3ML nebulizer solution 3 mL  3 mL Nebulization Q6H PRN Noralee Stain, DO      . lidocaine (XYLOCAINE) 1 % (with pres) injection           . lidocaine (XYLOCAINE) 1 % (with pres) injection   Infiltration PRN Gershon Crane, PA-C   10 mL at 12/04/17 8416  . MEDLINE mouth rinse  15 mL Mouth Rinse q12n4p Dow Adolph N, DO   15 mL at 12/03/17 1627  . metroNIDAZOLE (FLAGYL) IVPB 500 mg  500 mg Intravenous Q8H Hall, Carole N, DO  100 mL/hr at 12/04/17 1049 500 mg at 12/04/17 1049  . midodrine (PROAMATINE) tablet 10 mg  10 mg Oral Q M,W,F Dow Adolph N, DO   10 mg at 12/04/17 1042  . RESOURCE THICKENUP CLEAR   Oral PRN Noralee Stain, DO      . sertraline (ZOLOFT) tablet 50 mg  50 mg Oral Daily [place], Enid Derry N, DO   50 mg at 12/04/17 1041  . sevelamer carbonate (RENVELA) tablet 2,400 mg  2,400 mg Oral TID WC Hall, Carole N, DO   2,400 mg at 12/04/17 1239  . sodium chloride 0.9 % bolus 250 mL  250 mL Intravenous Once Doug Sou, MD      . vancomycin (VANCOCIN) IVPB 750 mg/150 ml premix  750 mg Intravenous Q M,W,F-HD Lore, Melissa A, RPH         Discharge Medications: Please see discharge summary for a list of discharge medications.  Relevant Imaging Results:  Relevant Lab Results:   Additional Information SSN: [id number]  Gildardo Griffes, LCSW

## 2017-12-04 NOTE — Progress Notes (Addendum)
CSW spoke with Bridget Manning patient's sister who reports plan is for [facility] to return [facility] when [facility]. She is unaware if patient's bed was being held, CSW will follow up.   [name] [place] in [place] and would like nurse to reach out regarding patient's medical diagnosis and plan of care. CSW will relay this information to nurse.   CSW will continue to coordinate dc plan with [facility] when patient medically ready to dc.   [place] Pole[place]ley Liba Hul[place], [place] [phone]

## 2017-12-05 LAB — CBC
HEMATOCRIT: 33.6 % — AB (ref 36.0–46.0)
Hemoglobin: 9.8 g/dL — ABNORMAL LOW (ref 12.0–15.0)
MCH: 28.5 pg (ref 26.0–34.0)
MCHC: 29.2 g/dL — ABNORMAL LOW (ref 30.0–36.0)
MCV: 97.7 fL (ref 80.0–100.0)
Platelets: 286 10*3/uL (ref 150–400)
RBC: 3.44 MIL/uL — ABNORMAL LOW (ref 3.87–5.11)
RDW: 15.9 % — ABNORMAL HIGH (ref 11.5–15.5)
WBC: 7.1 10*3/uL (ref 4.0–10.5)
nRBC: 0 % (ref 0.0–0.2)

## 2017-12-05 LAB — BASIC METABOLIC PANEL
Anion gap: 17 — ABNORMAL HIGH (ref 5–15)
BUN: 44 mg/dL — ABNORMAL HIGH (ref 8–23)
CO2: 25 mmol/L (ref 22–32)
Calcium: 8.6 mg/dL — ABNORMAL LOW (ref 8.9–10.3)
Chloride: 99 mmol/L (ref 98–111)
Creatinine, Ser: 4.89 mg/dL — ABNORMAL HIGH (ref 0.44–1.00)
GFR calc Af Amer: 10 mL/min — ABNORMAL LOW (ref >60)
GFR calc non Af Amer: 8 mL/min — ABNORMAL LOW (ref >60)
Glucose, Bld: 115 mg/dL — ABNORMAL HIGH (ref 70–99)
Potassium: 3.7 mmol/L (ref 3.5–5.1)
Sodium: 141 mmol/L (ref 135–145)

## 2017-12-05 LAB — GLUCOSE, CAPILLARY
GLUCOSE-CAPILLARY: 105 mg/dL — AB (ref 70–99)
Glucose-Capillary: 105 mg/dL — ABNORMAL HIGH (ref 70–99)
Glucose-Capillary: 107 mg/dL — ABNORMAL HIGH (ref 70–99)
Glucose-Capillary: 109 mg/dL — ABNORMAL HIGH (ref 70–99)

## 2017-12-05 MED ORDER — AMOXICILLIN-POT CLAVULANATE 500-125 MG PO TABS
500.0000 mg | ORAL_TABLET | Freq: Every day | ORAL | Status: DC
  Administered 2017-12-05 – 2017-12-06 (×2): 500 mg via ORAL
  Filled 2017-12-05 (×2): qty 1

## 2017-12-05 MED ORDER — HEPARIN SODIUM (PORCINE) 1000 UNIT/ML DIALYSIS: 1000.0000 [IU] | INTRAMUSCULAR | Status: DC | PRN

## 2017-12-05 MED ORDER — DOXERCALCIFEROL 4 MCG/2ML IV SOLN
INTRAVENOUS | Status: AC
  Filled 2017-12-05: qty 2

## 2017-12-05 MED ORDER — ALBUMIN HUMAN 25 % IV SOLN
INTRAVENOUS | Status: AC
  Filled 2017-12-05: qty 50

## 2017-12-05 MED ORDER — DOXERCALCIFEROL 0.5 MCG PO CAPS
ORAL_CAPSULE | ORAL | Status: AC
  Filled 2017-12-05: qty 2

## 2017-12-05 MED ORDER — ALBUMIN HUMAN 25 % IV SOLN
25.0000 g | Freq: Once | INTRAVENOUS | Status: AC
  Administered 2017-12-05: 25 g via INTRAVENOUS

## 2017-12-05 MED ORDER — MIDODRINE HCL 5 MG PO TABS
ORAL_TABLET | ORAL | Status: AC
  Filled 2017-12-05: qty 2

## 2017-12-05 MED ORDER — ALBUMIN HUMAN 25 % IV SOLN
INTRAVENOUS | Status: AC
  Administered 2017-12-05: 25 g via INTRAVENOUS
  Filled 2017-12-05: qty 50

## 2017-12-05 NOTE — Progress Notes (Signed)
PROGRESS NOTE    Bridget Manning  ION:[id number] DOB: 07-02-1945 DOA: 12/02/2017 PCP: Rogers Seedsarr, Sandra, MD     Brief Narrative:  Bridget BeckwithMarion Machlanis a 72 y.o.femalewithpastmedical history significant forformertobacco user, end-stage renal disease on dialysis Monday Wednesday Friday, type 2 diabetes, chronic hypotension on midodrine,who [facility] with worsening weakness and a fever of 100.8. Large right pleural effusion found.   New events last 24 hours / Subjective: No new events, afebrile   Assessment & Plan:   Active Problems:   Acute respiratory failure with hypoxia (HCC)   Pressure injury of skin   Acute respiratory failure with hypoxia secondary to large right pleural effusion, HCAP, ?aspiration  -S/p right thoracentesis consistent with exudative process. Pleural fluid culture negative to date  -Continue to wean [place] O2 to room air   -Plan to deescalate antibiotics to PO augmentin to cover aspiration  Coag neg staph bacteremia -Likely contaminant in 1 of 2 set of blood cultures   Acute metabolic encephalopathy suspect secondary to hypoxia versus possible infective process versus others -Per her sister,at her baseline she is more alert with no prior history of dementia that she knows of  Chronic hypotension -Continue midodrine   End-stage renal disease on hemodialysis Monday Wednesday Friday -Per Nephrology   Anemia of chronic disease secondary to end-stage renal disease -Monitor  Type 2 diabetes with complication including ESRD  -A1c 5.2 -Novolog SSI  CAD -Continue aspirin, lipitor   Dysphagia -SLP eval  Depression -Continue zoloft   DVT prophylaxis: Subq hep Code Status: DNR Family Communication: No family at bedside Disposition Plan: Deescalate antibiotics to PO (has spit out PO medication this morning) and stabilize BP (low during HD). If stable, plan for discharge to SNF in AM    Consultants:    Nephrology  IR   Procedures:   Thoracentesis 12/4   Antimicrobials:  Anti-infectives (From admission, onward)   Start     Dose/Rate Route Frequency Ordered Stop   12/06/17 1200  vancomycin (VANCOCIN) IVPB 750 mg/150 ml premix  Status:  Discontinued     750 mg 150 mL/hr over 60 Minutes Intravenous Every M-W-F (Hemodialysis) 12/04/17 1901 12/05/17 1429   12/05/17 1430  amoxicillin-clavulanate (AUGMENTIN) 875-125 MG per tablet 1 tablet     1 tablet Oral Every 12 hours 12/05/17 1429     12/05/17 1200  vancomycin (VANCOCIN) IVPB 750 mg/150 ml premix     750 mg 150 mL/hr over 60 Minutes Intravenous Every T-Th-Sa (Hemodialysis) 12/04/17 1901 12/05/17 1039   12/04/17 1800  ceFEPIme (MAXIPIME) 2 g in sodium chloride 0.9 % 100 mL IVPB  Status:  Discontinued     2 g 200 mL/hr over 30 Minutes Intravenous Every M-W-F (1800) 12/04/17 1109 12/04/17 1702   12/04/17 1800  piperacillin-tazobactam (ZOSYN) IVPB 3.375 g  Status:  Discontinued     3.375 g 12.5 mL/hr over 240 Minutes Intravenous Every 12 hours 12/04/17 1702 12/05/17 1429   12/04/17 1200  vancomycin (VANCOCIN) IVPB 750 mg/150 ml premix  Status:  Discontinued     750 mg 150 mL/hr over 60 Minutes Intravenous Every M-W-F (Hemodialysis) 12/04/17 1109 12/04/17 1901   12/03/17 1800  ceFEPIme (MAXIPIME) 1 g in sodium chloride 0.9 % 100 mL IVPB  Status:  Discontinued     1 g 200 mL/hr over 30 Minutes Intravenous Every 24 hours 12/02/17 1410 12/04/17 1104   12/03/17 1200  vancomycin (VANCOCIN) IVPB 1000 mg/200 mL premix  Status:  Discontinued  1,000 mg 200 mL/hr over 60 Minutes Intravenous Every Tue (Hemodialysis) 12/03/17 0827 12/04/17 1104   12/03/17 0933  vancomycin (VANCOCIN) 1-5 GM/200ML-% IVPB    Note to Pharmacy:  Verna Czech   : cabinet override      12/03/17 0933 12/03/17 2144   12/03/17 0200  metroNIDAZOLE (FLAGYL) IVPB 500 mg  Status:  Discontinued     500 mg 100 mL/hr over 60 Minutes Intravenous Every 8 hours 12/03/17  0033 12/04/17 1702   12/02/17 2118  vancomycin variable dose per unstable renal function (pharmacist dosing)  Status:  Discontinued      Does not apply See admin instructions 12/02/17 2119 12/04/17 1109   12/02/17 1300  ceFEPIme (MAXIPIME) 2 g in sodium chloride 0.9 % 100 mL IVPB     2 g 200 mL/hr over 30 Minutes Intravenous  Once 12/02/17 1255 12/02/17 1515   12/02/17 1300  metroNIDAZOLE (FLAGYL) IVPB 500 mg  Status:  Discontinued     500 mg 100 mL/hr over 60 Minutes Intravenous Every 8 hours 12/02/17 1255 12/03/17 0033   12/02/17 1300  vancomycin (VANCOCIN) IVPB 1000 mg/200 mL premix     1,000 mg 200 mL/hr over 60 Minutes Intravenous  Once 12/02/17 1255 12/02/17 1702       Objective: Vitals:   12/05/17 0930 12/05/17 1000 12/05/17 1030 12/05/17 1110  BP: (!) 115/57 (!) 105/51 (!) 112/55 (!) 150/63  Pulse: 65 67 (!) 164 69  Resp:   14   Temp:   98.2 F (36.8 C) 98.1 F (36.7 C)  TempSrc:   Oral Oral  SpO2:    99%  Weight:   61 kg     Intake/Output Summary (Last 24 hours) at 12/05/2017 1429 Last data filed at 12/05/2017 1039 Gross per 24 hour  Intake 1037.03 ml  Output 639 ml  Net 398.03 ml   Filed Weights   12/04/17 2023 12/05/17 0651 12/05/17 1030  Weight: 63.9 kg 61.7 kg 61 kg      Examination: General exam: Appears calm and comfortable  Respiratory system: diminished but clear, respiratory effort normal  Cardiovascular system: S1 & S2 heard, RRR. +systolic murmur  Gastrointestinal system: Abdomen is nondistended, soft and nontender. No organomegaly or masses felt. Normal bowel sounds heard. Central nervous system: Alert and oriented. Extremities: Symmetric with contractures of hands noted  Skin: No rashes, lesions or ulcers     Data Reviewed: I have personally reviewed following labs and imaging studies  CBC: Recent Labs  Lab 12/02/17 1307 12/02/17 1315 12/03/17 0557 12/04/17 0805 12/05/17 0539  WBC 10.5  --  8.8 8.2 7.1  NEUTROABS 8.7*  --  7.6  --    --   HGB 9.8* 9.5* 9.8* 9.4* 9.8*  HCT 35.2* 28.0* 34.2* 31.5* 33.6*  MCV 102.3*  --  100.0 96.6 97.7  PLT 234  --  228 237 286   Basic Metabolic Panel: Recent Labs  Lab 12/02/17 1307 12/02/17 1315 12/03/17 0557 12/04/17 0805 12/05/17 0539  NA 142 142 143 141 141  K 4.8 4.5 5.4* 3.5 3.7  CL 99 104 101 99 99  CO2 26  --  22 23 25   GLUCOSE 88 96 97 87 115*  BUN 68* 69* 80* 31* 44*  CREATININE 7.60* 7.80* 7.93* 4.18* 4.89*  CALCIUM 8.7*  --  8.5* 8.7* 8.6*  MG  --   --  2.4  --   --   PHOS  --   --  6.1*  --   --  GFR: CrCl cannot be calculated (Unknown ideal weight.). Liver Function Tests: Recent Labs  Lab 12/02/17 1307  AST 19  ALT 11  ALKPHOS 95  BILITOT 1.5*  PROT 6.6  ALBUMIN 2.8*   No results for input(s): LIPASE, AMYLASE in the last 168 hours. Recent Labs  Lab 12/02/17 1307  AMMONIA 11   Coagulation Profile: No results for input(s): INR, PROTIME in the last 168 hours. Cardiac Enzymes: No results for input(s): CKTOTAL, CKMB, CKMBINDEX, TROPONINI in the last 168 hours. BNP (last 3 results) No results for input(s): PROBNP in the last 8760 hours. HbA1C: Recent Labs    12/02/17 2100  HGBA1C 5.2   CBG: Recent Labs  Lab 12/04/17 1230 12/04/17 1635 12/04/17 2022 12/05/17 0034 12/05/17 1114  GLUCAP 172* 79 140* 105* 109*   Lipid Profile: No results for input(s): CHOL, HDL, LDLCALC, TRIG, CHOLHDL, LDLDIRECT in the last 72 hours. Thyroid Function Tests: No results for input(s): TSH, T4TOTAL, FREET4, T3FREE, THYROIDAB in the last 72 hours. Anemia Panel: Recent Labs    12/02/17 2100  FERRITIN 2,496*  TIBC 112*  IRON 25*   Sepsis Labs: Recent Labs  Lab 12/02/17 1315 12/02/17 2100  PROCALCITON  --  14.70  LATICACIDVEN 0.44*  --     Recent Results (from the past 240 hour(s))  Blood culture (routine x 2)     Status: None (Preliminary result)   Collection Time: 12/02/17  1:56 PM  Result Value Ref Range Status   Specimen Description BLOOD  RIGHT HAND  Final   Special Requests   Final    BOTTLES DRAWN AEROBIC AND ANAEROBIC Blood Culture results may not be optimal due to an inadequate volume of blood received in culture bottles   Culture   Final    NO GROWTH 3 DAYS Performed at [facility] Lab, [address], [place], [place] [address]    Report Status PENDING  Incomplete  Blood culture (routine x 2)     Status: Abnormal (Preliminary result)   Collection Time: 12/02/17  1:56 PM  Result Value Ref Range Status   Specimen Description BLOOD SITE NOT SPECIFIED  Final   Special Requests   Final    BOTTLES DRAWN AEROBIC AND ANAEROBIC Blood Culture adequate volume   Culture  Setup Time   Final    GRAM POSITIVE COCCI IN BOTH AEROBIC AND ANAEROBIC BOTTLES CRITICAL RESULT CALLED TO, READ BACK BY AND VERIFIED WITH: PHARMD M MACCIA [id number] MLM    Culture (A)  Final    STAPHYLOCOCCUS SPECIES (COAGULASE NEGATIVE) THE SIGNIFICANCE OF ISOLATING THIS ORGANISM FROM A SINGLE SET OF BLOOD CULTURES WHEN MULTIPLE SETS ARE DRAWN IS UNCERTAIN. PLEASE NOTIFY THE MICROBIOLOGY DEPARTMENT WITHIN ONE WEEK IF SPECIATION AND SENSITIVITIES ARE REQUIRED. Performed at [facility] Lab, [address], [place], [place] [address]    Report Status PENDING  Incomplete  Blood Culture ID Panel (Reflexed)     Status: Abnormal   Collection Time: 12/02/17  1:56 PM  Result Value Ref Range Status   Enterococcus species NOT DETECTED NOT DETECTED Final   Listeria monocytogenes NOT DETECTED NOT DETECTED Final   Staphylococcus species DETECTED (A) NOT DETECTED Final    Comment: Methicillin (oxacillin) resistant coagulase negative staphylococcus. Possible blood culture contaminant (unless isolated from more than one blood culture draw or clinical case suggests pathogenicity). No antibiotic treatment is indicated for blood  culture contaminants. CRITICAL RESULT CALLED TO, READ BACK BY AND VERIFIED WITH: PHARMD M MACCIA [id number] 0910 MLM  Staphylococcus aureus  (BCID) NOT DETECTED NOT DETECTED Final   Methicillin resistance DETECTED (A) NOT DETECTED Final    Comment: CRITICAL RESULT CALLED TO, READ BACK BY AND VERIFIED WITH: PHARMD M MACCIA [id number] 0910 MLM    Streptococcus species NOT DETECTED NOT DETECTED Final   Streptococcus agalactiae NOT DETECTED NOT DETECTED Final   Streptococcus pneumoniae NOT DETECTED NOT DETECTED Final   Streptococcus pyogenes NOT DETECTED NOT DETECTED Final   Acinetobacter baumannii NOT DETECTED NOT DETECTED Final   Enterobacteriaceae species NOT DETECTED NOT DETECTED Final   Enterobacter cloacae complex NOT DETECTED NOT DETECTED Final   Escherichia coli NOT DETECTED NOT DETECTED Final   Klebsiella oxytoca NOT DETECTED NOT DETECTED Final   Klebsiella pneumoniae NOT DETECTED NOT DETECTED Final   Proteus species NOT DETECTED NOT DETECTED Final   Serratia marcescens NOT DETECTED NOT DETECTED Final   Haemophilus influenzae NOT DETECTED NOT DETECTED Final   Neisseria meningitidis NOT DETECTED NOT DETECTED Final   Pseudomonas aeruginosa NOT DETECTED NOT DETECTED Final   Candida albicans NOT DETECTED NOT DETECTED Final   Candida glabrata NOT DETECTED NOT DETECTED Final   Candida krusei NOT DETECTED NOT DETECTED Final   Candida parapsilosis NOT DETECTED NOT DETECTED Final   Candida tropicalis NOT DETECTED NOT DETECTED Final    Comment: Performed at [facility] Lab, [address], [place], [place] [address]  MRSA PCR Screening     Status: Abnormal   Collection Time: 12/03/17  4:38 PM  Result Value Ref Range Status   MRSA by PCR POSITIVE (A) NEGATIVE Final    Comment:        The GeneXpert MRSA Assay (FDA approved for NASAL specimens only), is one component of a comprehensive MRSA colonization surveillance program. It is not intended to diagnose MRSA infection nor to guide or monitor treatment for MRSA infections. CRITICAL RESULT CALLED TO, READ BACK BY AND VERIFIED WITH: Lyndle Herrlich, RN AT 1820 ON 12/03/17 BY C.  JESSUP, MLT. Performed at [facility] Lab, [address] [place], [place] [address]   Gram stain     Status: None   Collection Time: 12/04/17 10:56 AM  Result Value Ref Range Status   Specimen Description PLEURAL RIGHT  Final   Special Requests NONE  Final   Gram Stain   Final    RARE WBC PRESENT, PREDOMINANTLY MONONUCLEAR NO ORGANISMS SEEN Performed at [facility] Lab, [address], [place], [place] [address]    Report Status 12/04/2017 FINAL  Final  Culture, body fluid-bottle     Status: None (Preliminary result)   Collection Time: 12/04/17 10:56 AM  Result Value Ref Range Status   Specimen Description PLEURAL RIGHT  Final   Special Requests NONE  Final   Culture   Final    NO GROWTH 1 DAY Performed at [facility], [address], [place], [place] [address]    Report Status PENDING  Incomplete       Radiology Studies: Dg Chest 1 View  Result Date: 12/04/2017 CLINICAL DATA:  Post RIGHT thoracentesis EXAM: CHEST  1 VIEW COMPARISON:  CT chest 12/02/2017. Chest x-ray 12/02/2017 and earlier. FINDINGS: Interval reduction in size of the RIGHT pleural effusion post thoracentesis, though a moderate-sized effusion persists. No pneumothorax. Improved aeration in the RIGHT lung, with atelectasis persisting at the RIGHT lung base. LEFT lung remains clear. Mild pulmonary venous hypertension without overt edema, unchanged over multiple prior examinations. Cardiac silhouette moderately enlarged, unchanged. Thoracic aorta atherosclerotic, unchanged.  LEFT subclavian artery stent again noted. IMPRESSION: 1. Reduction in size of the RIGHT pleural effusion post thoracentesis, though a moderate sized effusion persists. 2. No pneumothorax. 3. Improved aeration in the RIGHT lung with atelectasis persisting at the RIGHT base. 4. No new abnormalities. Electronically Signed   By: Hulan Saas M.D.   On: 12/04/2017 09:56   Dg Swallowing Func-speech Pathology  Result Date:  12/04/2017 Objective Swallowing Evaluation: Type of Study: MBS-Modified Barium Swallow Study  Patient Details Name: Orli Degrave MRN: [id number] Date of Birth: 08-11-1945 Today's Date: 12/04/2017 Time: SLP Start Time (ACUTE ONLY): 1143 -SLP Stop Time (ACUTE ONLY): 1202 SLP Time Calculation (min) (ACUTE ONLY): 19 min Past Medical History: Past Medical History: Diagnosis Date . Diabetes mellitus without complication (HCC)  . Renal disorder  . Stroke (HCC)  . UTI (urinary tract infection)  Past Surgical History: Past Surgical History: Procedure Laterality Date . A/V FISTULAGRAM Left 03/05/2017  Procedure: A/V FISTULAGRAM;  Surgeon: Nada Libman, MD;  Location: [facility] INVASIVE CV LAB;  Service: Cardiovascular;  Laterality: Left; . IR THORACENTESIS ASP PLEURAL SPACE W/IMG GUIDE  12/04/2017 HPI: Lacrystal Barbe is a 72 y.o. female with PMH significant for stroke, former tobacco use, ERSD on HD, type 2 DM, chronic hypotension, who presented to [facility] ED from SNF on 12/02/17 after being found with worsening weakness, fever,  AMS and acute respiratory failure with hypoxia- diagnosed with healthcare acquired pneumonia and ESRD. 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  No data recorded Assessment / Plan / Recommendation CHL IP CLINICAL IMPRESSIONS 12/04/2017 Clinical Impression Pt demonstrated mild oral and moderate pharyngeal dysphagia marked by anterior loss and silent aspiration of thin liquids. Pt's swallow initiation consistently occurred at the pyriform sinuses with both thin and nectar liquids, however her delayed initiation resulted in more severe implications with thin  as opposed to nectar. Thin liquid entered the vestibule prior to swallow initiation, and was subsequently aspirated during the swallow without any reflexive response. A smaller volume of penetration at and above the vocal folds was observed in 3 out of 6 nectar trials. Pt demonstrates impulsivity evidenced by large sips of liquid, however she was unable to follow commands to take smaller boluses and expectorated all attempts at controlled trials delivered by the clinician. Pt exhibited a timely and efficient swallow with puree texture without threat of airway instrusion. Recommend puree texture diet, nectar thick liquids, meds crushed in applesauce, and FULL supervision to remind pt to use slow rate, take small bites and sips to the best of her ability, and perform intermittent cough/throat clear throughout PO intake. ST will continue to follow her in the acute setting to provide skilled dysphagia intervention including education,  safe swallow strategy training, and opportunities for diet/liquid advancement.  SLP Visit Diagnosis Dysphagia, oropharyngeal phase (R13.12) Attention and concentration deficit following -- Frontal lobe and executive function deficit following -- Impact on safety and function Severe aspiration risk   CHL IP TREATMENT RECOMMENDATION 12/04/2017 Treatment Recommendations Therapy as outlined in treatment plan below   Prognosis 12/04/2017 Prognosis for Safe Diet Advancement Fair Barriers to Reach Goals Cognitive deficits Barriers/Prognosis Comment -- CHL IP DIET RECOMMENDATION 12/04/2017 SLP Diet Recommendations Dysphagia 1 (Puree) solids;Nectar thick liquid Liquid Administration via  Cup Medication Administration Crushed with puree Compensations Small sips/bites;Slow rate;Minimize environmental distractions;Clear throat intermittently Postural Changes Seated upright at 90 degrees   CHL IP OTHER RECOMMENDATIONS 12/04/2017 Recommended Consults -- Oral Care Recommendations Oral care BID Other  Recommendations Order thickener from pharmacy;Remove water pitcher;Have oral suction available   CHL IP FOLLOW UP RECOMMENDATIONS 12/04/2017 Follow up Recommendations Skilled Nursing facility   [facility] IP FREQUENCY AND DURATION 12/04/2017 Speech Therapy Frequency (ACUTE ONLY) min 2x/week Treatment Duration 2 weeks      CHL IP ORAL PHASE 12/04/2017 Oral Phase Impaired Oral - Pudding Teaspoon -- Oral - Pudding Cup -- Oral - Honey Teaspoon -- Oral - Honey Cup -- Oral - Nectar Teaspoon -- Oral - Nectar Cup Right anterior bolus loss Oral - Nectar Straw -- Oral - Thin Teaspoon -- Oral - Thin Cup Right anterior bolus loss Oral - Thin Straw -- Oral - Puree WFL Oral - Mech Soft -- Oral - Regular -- Oral - Multi-Consistency -- Oral - Pill -- Oral Phase - Comment --  CHL IP PHARYNGEAL PHASE 12/04/2017 Pharyngeal Phase Impaired Pharyngeal- Pudding Teaspoon -- Pharyngeal -- Pharyngeal- Pudding Cup -- Pharyngeal -- Pharyngeal- Honey Teaspoon -- Pharyngeal -- Pharyngeal- Honey Cup -- Pharyngeal -- Pharyngeal- Nectar Teaspoon -- Pharyngeal -- Pharyngeal- Nectar Cup Penetration/Aspiration during swallow;Delayed swallow initiation-pyriform sinuses Pharyngeal Material enters airway, CONTACTS cords and not ejected out;Material enters airway, remains ABOVE vocal cords and not ejected out Pharyngeal- Nectar Straw -- Pharyngeal -- Pharyngeal- Thin Teaspoon -- Pharyngeal -- Pharyngeal- Thin Cup Penetration/Aspiration before swallow;Penetration/Aspiration during swallow;Delayed swallow initiation-pyriform sinuses Pharyngeal Material enters airway, passes BELOW cords without attempt by patient to eject out (silent aspiration);Material enters airway, CONTACTS cords and not ejected out Pharyngeal- Thin Straw -- Pharyngeal -- Pharyngeal- Puree WFL Pharyngeal -- Pharyngeal- Mechanical Soft -- Pharyngeal -- Pharyngeal- Regular -- Pharyngeal -- Pharyngeal- Multi-consistency -- Pharyngeal -- Pharyngeal- Pill -- Pharyngeal -- Pharyngeal Comment --  CHL IP  CERVICAL ESOPHAGEAL PHASE 12/04/2017 Cervical Esophageal Phase WFL Pudding Teaspoon -- Pudding Cup -- Honey Teaspoon -- Honey Cup -- Nectar Teaspoon -- Nectar Cup -- Nectar Straw -- Thin Teaspoon -- Thin Cup -- Thin Straw -- Puree -- Mechanical Soft -- Regular -- Multi-consistency -- Pill -- Cervical Esophageal Comment -- Royce Macadamia 12/04/2017, 3:30 PM  Breck Coons [employer] administrator Pager [phone] Office [phone]             Ir Thoracentesis Asp Pleural Space W/img Guide  Result Date: 12/04/2017 INDICATION: End stage renal disease on hemodialysis. Hypoxia. Loculated appearing right pleural effusion. Request for diagnostic and therapeutic thoracentesis. EXAM: ULTRASOUND GUIDED RIGHT THORACENTESIS MEDICATIONS: 1% lidocaine 10 mL COMPLICATIONS: None immediate. PROCEDURE: An ultrasound guided thoracentesis was thoroughly discussed with the patient and questions answered. The benefits, risks, alternatives and complications were also discussed. The patient understands and wishes to proceed with the procedure. Written consent was obtained. Ultrasound was performed to localize and mark an adequate pocket of fluid in the right chest. The area was then prepped and draped in the normal sterile fashion. 1% Lidocaine was used for local anesthesia. Under ultrasound guidance a 6 Fr Safe-T-Centesis catheter was introduced. Thoracentesis was performed. The catheter was removed and a dressing applied. FINDINGS: A total of approximately 400 mL of clear yellow fluid was removed. Samples were sent to the laboratory as requested by the clinical team. IMPRESSION: Successful ultrasound guided right thoracentesis yielding 400 mL of pleural fluid. No pneumothorax on post-procedure chest x-ray. Read by: Corrin Parker, PA-C Electronically Signed  By: Simonne Come M.D.   On: 12/04/2017 10:14      Scheduled Meds: . amoxicillin-clavulanate  1 tablet Oral Q12H  . aspirin  81 mg Oral Daily  .  atorvastatin  40 mg Oral QHS  . chlorhexidine  15 mL Mouth Rinse BID  . Chlorhexidine Gluconate Cloth  6 each Topical Q0600  . doxercalciferol  1 mcg Intravenous Q M,W,F-HD  . heparin injection (subcutaneous)  5,000 Units Subcutaneous Q8H  . insulin aspart  0-9 Units Subcutaneous TID WC  . mouth rinse  15 mL Mouth Rinse q12n4p  . midodrine  10 mg Oral Q M,W,F  . sertraline  50 mg Oral Daily  . sevelamer carbonate  2,400 mg Oral TID WC   Continuous Infusions: . sodium chloride    . sodium chloride       LOS: 3 days    Time spent: 25 minutes   Noralee Stain, DO [facility] [email or web address] Password [email or web address] 12/05/2017, 2:29 PM

## 2017-12-05 NOTE — Progress Notes (Signed)
Modified Barium Swallow Progress Note  Patient Details  Name: Bridget Manning MRN: [id number] Date of Birth: 1945-07-10  Today's Date: 12/05/2017  Modified Barium Swallow completed.  Full report located under Chart Review in the Imaging Section.  Brief recommendations include the following:  Clinical Impression Pt demonstrated mild oral and moderate pharyngeal dysphagia marked by anterior loss and silent aspiration of thin liquids. Pt's swallow initiation consistently occurred at the pyriform sinuses with both thin and nectar liquids, however her delayed initiation resulted in more severe implications with thin as opposed to nectar. Thin liquid entered the vestibule prior to swallow initiation, and was subsequently aspirated during the swallow without any reflexive response. A smaller volume of penetration at and above the vocal folds was observed in 3 out of 6 nectar trials. Pt demonstrates impulsivity evidenced by large sips of liquid, however she was unable to follow commands to take smaller boluses and expectorated all attempts at controlled trials delivered by the clinician. Pt exhibited a timely and efficient swallow with puree texture without threat of airway instrusion. Recommend puree texture diet, nectar thick liquids, meds crushed in applesauce, and FULL supervision to remind pt to use slow rate, take small bites and sips to the best of her ability, and perform intermittent cough/throat clear throughout PO intake. ST will continue to follow her in the acute setting to provide skilled dysphagia intervention including education, safe swallow strategy training, and opportunities for diet/liquid advancement.       Swallow Evaluation Recommendations : Puree diet Dys 1, nectar thick                                         Royce MacadamiaLitaker, Darrow Barreiro Willis 12/05/2017,11:55 AM  Breck CoonsLisa Willis Lonell FaceLitaker M.Ed Nurse, children'sCCC-SLP Speech-Language Pathologist Pager [phone] Office [phone]

## 2017-12-05 NOTE — Procedures (Signed)
Patient seen on Hemodialysis. QB 300, UF goal 1.5L Some hypotension noted at dialysis- she earlier spit out midodrine. Will give 25gm albumin bolus.  Clinically appears better with hemodialysis and antibiotics- encephalopathy appears resolved. May be able to narrow spectrum and consider DC soon.  Zetta BillsJay Erico Stan MD [facility]. Office # [phone] Pager # [phone] 8:38 AM

## 2017-12-06 LAB — CBC
HCT: 36.5 % (ref 36.0–46.0)
Hemoglobin: 10.7 g/dL — ABNORMAL LOW (ref 12.0–15.0)
MCH: 29 pg (ref 26.0–34.0)
MCHC: 29.3 g/dL — AB (ref 30.0–36.0)
MCV: 98.9 fL (ref 80.0–100.0)
Platelets: 274 10*3/uL (ref 150–400)
RBC: 3.69 MIL/uL — ABNORMAL LOW (ref 3.87–5.11)
RDW: 16 % — ABNORMAL HIGH (ref 11.5–15.5)
WBC: 8.9 10*3/uL (ref 4.0–10.5)
nRBC: 0 % (ref 0.0–0.2)

## 2017-12-06 LAB — GLUCOSE, CAPILLARY
Glucose-Capillary: 106 mg/dL — ABNORMAL HIGH (ref 70–99)
Glucose-Capillary: 117 mg/dL — ABNORMAL HIGH (ref 70–99)
Glucose-Capillary: 123 mg/dL — ABNORMAL HIGH (ref 70–99)
Glucose-Capillary: 146 mg/dL — ABNORMAL HIGH (ref 70–99)
Glucose-Capillary: 95 mg/dL (ref 70–99)

## 2017-12-06 LAB — BASIC METABOLIC PANEL
Anion gap: 13 (ref 5–15)
BUN: 20 mg/dL (ref 8–23)
CO2: 31 mmol/L (ref 22–32)
Calcium: 8.9 mg/dL (ref 8.9–10.3)
Chloride: 98 mmol/L (ref 98–111)
Creatinine, Ser: 3.2 mg/dL — ABNORMAL HIGH (ref 0.44–1.00)
GFR calc non Af Amer: 14 mL/min — ABNORMAL LOW (ref >60)
GFR, EST AFRICAN AMERICAN: 16 mL/min — AB (ref >60)
Glucose, Bld: 125 mg/dL — ABNORMAL HIGH (ref 70–99)
Potassium: 3.5 mmol/L (ref 3.5–5.1)
Sodium: 142 mmol/L (ref 135–145)

## 2017-12-06 LAB — PH, BODY FLUID: pH, Body Fluid: 7.3

## 2017-12-06 MED ORDER — VANCOMYCIN HCL IN DEXTROSE 750-5 MG/150ML-% IV SOLN
INTRAVENOUS | Status: AC
  Filled 2017-12-06: qty 150

## 2017-12-06 MED ORDER — DOXERCALCIFEROL 4 MCG/2ML IV SOLN
INTRAVENOUS | Status: AC
  Filled 2017-12-06: qty 2

## 2017-12-06 MED ORDER — AMOXICILLIN-POT CLAVULANATE 500-125 MG PO TABS: 500.0000 mg | ORAL_TABLET | Freq: Every day | ORAL | 0 refills | Status: AC

## 2017-12-06 MED ORDER — ALBUMIN HUMAN 25 % IV SOLN
25.0000 g | Freq: Once | INTRAVENOUS | Status: AC
  Administered 2017-12-06: 25 g via INTRAVENOUS

## 2017-12-06 MED ORDER — ALBUMIN HUMAN 25 % IV SOLN
INTRAVENOUS | Status: AC
  Administered 2017-12-06: 12.5 g
  Filled 2017-12-06: qty 100

## 2017-12-06 NOTE — Procedures (Signed)
Patient seen on Hemodialysis. BP (!) 106/45   Pulse 71   Temp 98.2 F (36.8 C) (Oral)   Resp 17   Ht 5\' 8"  (1.727 m)   Wt 61.9 kg   SpO2 99%   BMI 20.75 kg/m   QB 400, UF goal 1.5L Tolerating treatment without complaints at this time.  Zetta BillsJay Weslyn Holsonback MD [facility]. Office # [phone] Pager # [phone] 12:13 PM

## 2017-12-06 NOTE — Progress Notes (Signed)
CSW notified by nurse that patient done with dialysis, [facility] has been called.   WidenerAshley Arieanna Pressey, [place] [phone]

## 2017-12-06 NOTE — Progress Notes (Signed)
Report given to nurse Isha, and answered all her questions, pt ready to pick up, oncoming nurse aware of the situation.

## 2017-12-06 NOTE — Progress Notes (Signed)
Patient will DC to: [facility] Anticipated DC date: 12/06/17 Family notified: Eulah CitizenPauline Transport by: Sharin MonsPTAR  [facility] . RN, patient, patient's family, and facility notified of DC. Discharge Summary sent to facility. RN given number for report [phone]. DC packet on chart. Ambulance transport requested for patient.  CSW signing off.  [place] Campbell NorthAshley Sieanna Van[place], [place]CS[place] [phone]

## 2017-12-06 NOTE — Discharge Summary (Addendum)
Physician Discharge Summary  Bridget Manning UUV:[id number] DOB: [birth year] DOA: 12/02/2017  PCP: Rogers Seeds, MD  Admit date: 12/02/2017 Discharge date: 12/06/2017  Admitted From: SNF Disposition:  SNF   Recommendations for Outpatient Follow-up:  1. Follow up with PCP in 1 week 2. HD MWF  3. Please follow up on the following pending results: Final pleural fluid culture results   Discharge Condition: Stable CODE STATUS: DNR  Diet recommendation: Dysphagia 1   Brief/Interim Summary: From H&P: Bridget Manning a 72 y.o.femalewithpastmedical history significant forformertobacco user, end-stage renal disease on dialysis Monday Wednesday Friday, type 2 diabetes, chronic hypotension on midodrine,who [facility] with worsening weakness and a fever of 100.8.Large right pleural effusion found. She underwent right sided thoracentesis which revealed exudative fluid, fluid culture negative to date. She was treated with IV antibiotics and continued to clinically improve. Antibiotic was deescalated and patient returned to SNF on discharge.   Discharge Diagnoses:  Active Problems:   Acute respiratory failure with hypoxia (HCC)   Pressure injury of skin  Acute on chronic hypoxemic respiratory failure with hypoxia secondary to large right pleural effusion, HCAP, ?aspiration  -Uses 2L [place] O2 at baseline at SNF  -S/p right thoracentesis consistent with exudative process. Pleural fluid culture negative to date  -Continue to wean [place] O2 to room air   -Deescalated to PO augmentin to cover aspiration (reduced dose due to ESRD), last day 12/8 for 7 day treatment   Coag neg staph bacteremia -Likely contaminant in 1 of 2 set of blood cultures   Acute metabolic encephalopathy suspect secondary to hypoxia versus possible infective process versus others -Per her sister,at her baseline she is more alert with no prior history of dementia that she knows of -Appears to  be close to baseline now   Chronic hypotension -Continue midodrine  -BP stable this morning, 121/56   End-stage renal disease on hemodialysis Monday Wednesday Friday -Per Nephrology   Anemia of chronic disease secondary to end-stage renal disease -Monitor  Type 2 diabetes with complication including ESRD  -A1c5.2 -Novolog SSI while in hospital   CAD -Continue aspirin, lipitor   Dysphagia -SLP eval  Depression -Continue zoloft  Pressure injury stage II Of bilateral buttocks, present on admission Wound care  Discharge Instructions   Allergies as of 12/06/2017      Reactions   Novocain [procaine] Anaphylaxis   Made her "stop breathing" after an eye injection appointment; 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   amoxicillin-clavulanate 500-125 MG tablet Commonly known as:  AUGMENTIN Take 1 tablet (500 mg total) by mouth daily for 2 days.   aspirin 81 MG chewable tablet Chew 81 mg by mouth daily.   atorvastatin 40 MG tablet Commonly known as:  LIPITOR Take 40 mg by mouth at bedtime.   carvedilol 6.25 MG tablet Commonly known as:  COREG Take 6.25 mg by mouth 2 (two) times daily.   CERTA-VITE PO Take 1 tablet by mouth at bedtime.   cetirizine 10 MG tablet Commonly  known as:  ZYRTEC Take 10 mg by mouth daily.   guaiFENesin 600 MG 12 hr tablet Commonly known as:  MUCINEX Take 600 mg by mouth 2 (two) times daily.   loperamide 2 MG tablet Commonly known as:  IMODIUM A-D Take 4 mg by mouth  every 6 (six) hours as needed for diarrhea or loose stools.   midodrine 10 MG tablet Commonly known as:  PROAMATINE Take 10 mg by mouth every Monday, Wednesday, and Friday. On dialysis days   OXYGEN Inhale 2 L into the lungs continuous.   pregabalin 50 MG capsule Commonly known as:  LYRICA Take 50 mg by mouth 2 (two) times daily.   sertraline 50 MG tablet Commonly known as:  ZOLOFT Take 50 mg by mouth daily.   sevelamer carbonate 800 MG tablet Commonly known as:  RENVELA Take 2,400 mg by mouth 3 (three) times daily with meals.      Follow-up Information    Rogers Seeds, MD. Schedule an appointment as soon as possible for a visit in 1 week(s).   Specialty:  Family Medicine Contact information: [address] [place]  [place] [address] [phone]          Allergies  Allergen Reactions  . 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 Latex Itching and Other (See Comments)    Causes "irritation" at site touched by latex (especially "powdered gloves")    Consultations:  Nephrology    Procedures/Studies: Dg Chest 1 View  Result Date: 12/04/2017 CLINICAL DATA:  Post RIGHT thoracentesis EXAM: CHEST  1 VIEW COMPARISON:  CT chest 12/02/2017. Chest x-ray 12/02/2017 and earlier. FINDINGS: Interval reduction in size of the RIGHT pleural effusion post thoracentesis, though a moderate-sized effusion persists. No pneumothorax. Improved aeration in the RIGHT lung, with atelectasis persisting at the RIGHT lung base. LEFT lung remains clear. Mild pulmonary venous hypertension without overt edema, unchanged over multiple prior examinations. Cardiac silhouette moderately enlarged, unchanged. Thoracic aorta atherosclerotic, unchanged. LEFT subclavian artery stent again noted. IMPRESSION: 1. Reduction in size of the RIGHT pleural effusion post thoracentesis, though a  moderate sized effusion persists. 2. No pneumothorax. 3. Improved aeration in the RIGHT lung with atelectasis persisting at the RIGHT base. 4. No new abnormalities. Electronically Signed   By: Hulan Saas M.D.   On: 12/04/2017 09:56   Ct Chest Wo Contrast  Result Date: 12/02/2017 CLINICAL DATA:  Differentiated sepsis with septic shock EXAM: CT CHEST WITHOUT CONTRAST TECHNIQUE: Multidetector CT imaging of the chest was performed following the standard protocol without IV contrast. COMPARISON:  03/14/2017 chest CT, same day CXR FINDINGS: Cardiovascular: Top-normal heart size with coronary arteriosclerosis. No pericardial effusion or thickening. Atherosclerosis of the thoracic aorta and branch vessels with stent in the proximal left subclavian artery. The unenhanced pulmonary vasculature is unremarkable. Mediastinum/Nodes: Assessment for hilar adenopathy is limited due to lack of IV contrast. 15 mm short axis right paratracheal lymph node is identified versus 9 mm previously. Small prevascular lymph nodes are noted subcentimeter mass in dimension. Calcified mildly enlarged thyroid gland consistent with multinodular goiter. Trachea is patent with slight luminal narrowing of the right mainstem bronchus. Lungs/Pleura: Interval increase in right-sided pleural effusion now moderate to large with areas of loculation along the anterior aspect. Adjacent compressive atelectasis and consolidation due to the fusion is identified with only minimal aeration of right upper lobe currently seen. Soft tissue debris noted  within middle and right lower lobe bronchi likely contributing to the areas of atelectasis and consolidation. Trace atelectasis at the left lung base. Upper Abdomen: Redemonstration of hypodense suprarenal mass measuring up to 4.2 cm may reflect an adrenal adenoma off the anterior limb. This is relatively stable in appearance. Otherwise, unremarkable upper abdomen. Musculoskeletal: No acute osseous  abnormality. Chronic compression deformities of the thoracolumbar spine at T6, T7, T11 through L1, greatest at L1. IMPRESSION: 1. Interval increase in moderate to large right pleural effusion with compressive atelectasis and pulmonary consolidation with minimal aeration of the right upper lobe now noted. 2. Soft tissue debris/mucous impaction within right middle and lower lobe bronchi likely contributing to postobstructive consolidation and atelectasis. 3. Trace left effusion with atelectasis. 4. Coronary arteriosclerosis and aortic atherosclerosis. Findings are stable in appearance. Stent in the left proximal subclavian artery. 5. Stable left adrenal mass compatible with adenoma. 6. Chronic midthoracic the thoracolumbar compression fractures. Aortic atherosclerosis Electronically Signed   By: Tollie Eth M.D.   On: 12/02/2017 19:21   Dg Chest Port 1 View  Result Date: 12/02/2017 CLINICAL DATA:  Acute mental status change.  Fever. EXAM: PORTABLE CHEST 1 VIEW COMPARISON:  May 22, 2017 FINDINGS: The right hemithorax is now almost completely opacified. Visualized cardiomediastinal silhouette and left lung are normal. A left-sided vascular stent is again identified. IMPRESSION: Near complete opacification of the right hemithorax likely represents fluid and underlying opacity. The finding is indeterminate. A CT scan could further evaluate. Electronically Signed   By: Gerome Sam III M.D   On: 12/02/2017 13:49   Dg Swallowing Func-speech Pathology  Result Date: 12/04/2017 Objective Swallowing Evaluation: Type of Study: MBS-Modified Barium Swallow Study  Patient Details Name: Bridget Manning MRN: [id number] Date of Birth: 1945/11/20 Today's Date: 12/04/2017 Time: SLP Start Time (ACUTE ONLY): 1143 -SLP Stop Time (ACUTE ONLY): 1202 SLP Time Calculation (min) (ACUTE ONLY): 19 min Past Medical History: Past Medical History: Diagnosis Date . Diabetes mellitus without complication (HCC)  . Renal disorder  . Stroke (HCC)   . UTI (urinary tract infection)  Past Surgical History: Past Surgical History: Procedure Laterality Date . A/V FISTULAGRAM Left 03/05/2017  Procedure: A/V FISTULAGRAM;  Surgeon: Nada Libman, MD;  Location: [facility] INVASIVE CV LAB;  Service: Cardiovascular;  Laterality: Left; . IR THORACENTESIS ASP PLEURAL SPACE W/IMG GUIDE  12/04/2017 HPI: Bridget Manning is a 72 y.o. female with PMH significant for stroke, former tobacco use, ERSD on HD, type 2 DM, chronic hypotension, who presented to [facility] ED from SNF on 12/02/17 after being found with worsening weakness, fever,  AMS and acute respiratory failure with hypoxia- diagnosed with healthcare acquired pneumonia and ESRD. 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  No data recorded Assessment / Plan / Recommendation CHL IP CLINICAL IMPRESSIONS 12/04/2017 Clinical Impression Pt demonstrated mild oral and moderate pharyngeal dysphagia marked by anterior loss and silent aspiration of thin liquids. Pt's swallow initiation consistently occurred at the pyriform sinuses with both thin and nectar liquids, however her delayed initiation resulted in more severe implications with thin as opposed to nectar. Thin liquid entered the vestibule prior to swallow initiation, and was subsequently aspirated during the swallow without any reflexive response. A smaller volume of penetration  at and above the vocal folds was observed in 3 out of 6 nectar trials. Pt demonstrates impulsivity evidenced by large sips of liquid, however she was unable to follow commands to take smaller boluses and expectorated all attempts at controlled trials delivered  by the clinician. Pt exhibited a timely and efficient swallow with puree texture without threat of airway instrusion. Recommend puree texture diet, nectar thick liquids, meds crushed in applesauce, and FULL supervision to remind pt to use slow rate, take small bites and sips to the best of her ability, and perform intermittent cough/throat clear throughout PO intake. ST will continue to follow her in the acute setting to provide skilled dysphagia intervention including education,  safe swallow strategy training, and opportunities for diet/liquid advancement.  SLP Visit Diagnosis Dysphagia, oropharyngeal phase (R13.12) Attention and concentration deficit following -- Frontal lobe and executive function deficit following -- Impact on safety and function Severe aspiration risk   CHL IP TREATMENT RECOMMENDATION 12/04/2017 Treatment Recommendations Therapy as outlined in treatment plan below   Prognosis 12/04/2017 Prognosis for Safe Diet Advancement Fair Barriers to Reach Goals Cognitive deficits Barriers/Prognosis Comment -- CHL IP DIET RECOMMENDATION 12/04/2017 SLP Diet Recommendations Dysphagia 1 (Puree) solids;Nectar thick liquid Liquid Administration via Cup Medication Administration Crushed with puree Compensations Small sips/bites;Slow rate;Minimize environmental distractions;Clear throat intermittently Postural Changes Seated upright at 90 degrees   CHL IP OTHER RECOMMENDATIONS 12/04/2017 Recommended Consults -- Oral Care Recommendations Oral care BID Other Recommendations Order thickener from pharmacy;Remove water pitcher;Have oral suction available   CHL IP FOLLOW UP RECOMMENDATIONS 12/04/2017 Follow up Recommendations Skilled Nursing facility   [facility] IP FREQUENCY AND DURATION 12/04/2017 Speech Therapy Frequency (ACUTE ONLY) min 2x/week Treatment Duration 2 weeks      CHL IP ORAL PHASE 12/04/2017 Oral Phase Impaired Oral - Pudding Teaspoon -- Oral - Pudding Cup -- Oral - Honey Teaspoon -- Oral - Honey Cup -- Oral -  Nectar Teaspoon -- Oral - Nectar Cup Right anterior bolus loss Oral - Nectar Straw -- Oral - Thin Teaspoon -- Oral - Thin Cup Right anterior bolus loss Oral - Thin Straw -- Oral - Puree WFL Oral - Mech Soft -- Oral - Regular -- Oral - Multi-Consistency -- Oral - Pill -- Oral Phase - Comment --  CHL IP PHARYNGEAL PHASE 12/04/2017 Pharyngeal Phase Impaired Pharyngeal- Pudding Teaspoon -- Pharyngeal -- Pharyngeal- Pudding Cup -- Pharyngeal -- Pharyngeal- Honey Teaspoon -- Pharyngeal -- Pharyngeal- Honey Cup -- Pharyngeal -- Pharyngeal- Nectar Teaspoon -- Pharyngeal -- Pharyngeal- Nectar Cup Penetration/Aspiration during swallow;Delayed swallow initiation-pyriform sinuses Pharyngeal Material enters airway, CONTACTS cords and not ejected out;Material enters airway, remains ABOVE vocal cords and not ejected out Pharyngeal- Nectar Straw -- Pharyngeal -- Pharyngeal- Thin Teaspoon -- Pharyngeal -- Pharyngeal- Thin Cup Penetration/Aspiration before swallow;Penetration/Aspiration during swallow;Delayed swallow initiation-pyriform sinuses Pharyngeal Material enters airway, passes BELOW cords without attempt by patient to eject out (silent aspiration);Material enters airway, CONTACTS cords and not ejected out Pharyngeal- Thin Straw -- Pharyngeal -- Pharyngeal- Puree WFL Pharyngeal -- Pharyngeal- Mechanical Soft -- Pharyngeal -- Pharyngeal- Regular -- Pharyngeal -- Pharyngeal- Multi-consistency -- Pharyngeal -- Pharyngeal- Pill -- Pharyngeal -- Pharyngeal Comment --  CHL IP CERVICAL ESOPHAGEAL PHASE 12/04/2017 Cervical Esophageal Phase WFL Pudding Teaspoon -- Pudding Cup -- Honey Teaspoon -- Honey Cup -- Nectar Teaspoon -- Nectar Cup -- Nectar Straw -- Thin Teaspoon -- Thin Cup -- Thin Straw -- Puree -- Mechanical Soft -- Regular -- Multi-consistency -- Pill -- Cervical Esophageal Comment -- Royce Macadamia 12/04/2017, 3:30 PM  Breck Coons [place] administrator Pager [phone] Office [phone]              Ir Thoracentesis Asp Pleural Space W/img Guide  Result Date: 12/04/2017 INDICATION: End stage renal disease on hemodialysis. Hypoxia. Loculated appearing right pleural effusion. Request for diagnostic and therapeutic thoracentesis. EXAM: ULTRASOUND GUIDED RIGHT THORACENTESIS MEDICATIONS: 1% lidocaine 10 mL COMPLICATIONS: None immediate. PROCEDURE: An ultrasound guided thoracentesis was thoroughly discussed with the patient and questions answered. The benefits, risks, alternatives and complications were also discussed. The patient understands and wishes to proceed with the procedure. Written consent was obtained. Ultrasound was performed to localize and mark an adequate pocket of fluid in the right chest. The area was then prepped and draped in the normal sterile fashion. 1% Lidocaine was used for local anesthesia. Under ultrasound guidance a 6 Fr Safe-T-Centesis catheter was introduced. Thoracentesis was performed. The catheter was removed and a dressing applied. FINDINGS: A total of approximately 400 mL of clear yellow fluid was removed. Samples were sent to the laboratory as requested by the clinical team. IMPRESSION: Successful ultrasound guided right thoracentesis yielding 400 mL of pleural fluid. No pneumothorax on post-procedure chest x-ray. Read by: Corrin Parker, PA-C Electronically Signed   By: Simonne Come M.D.   On: 12/04/2017 10:14       Discharge Exam: Vitals:   12/06/17 0448 12/06/17 0822  BP: 111/60 (!) 121/56  Pulse: 74 76  Resp: 18 18  Temp: 98.5 F (36.9 C) (!) 97.5 F (36.4 C)  SpO2: 100% 100%    General: Pt is alert, awake, not in acute distress Cardiovascular: RRR, S1/S2 +, +systolic murmur  Respiratory: CTA bilaterally, no wheezing, no rhonchi, no acute respiratory distress, on [place] O2  Abdominal: Soft, NT, ND, bowel sounds + Extremities: no edema, no cyanosis    The results of significant diagnostics from this hospitalization (including imaging, microbiology,  ancillary and laboratory) are listed below for reference.     Microbiology: Recent Results (from the past 240 hour(s))  Blood culture (routine x 2)     Status: None (Preliminary result)   Collection Time: 12/02/17  1:56 PM  Result Value Ref Range Status   Specimen Description BLOOD RIGHT HAND  Final   Special Requests   Final    BOTTLES DRAWN AEROBIC AND ANAEROBIC Blood Culture results may not be optimal due to an inadequate volume of blood received in culture bottles   Culture   Final    NO GROWTH 4 DAYS Performed at [facility], [address], [place], [place] [address]    Report Status PENDING  Incomplete  Blood culture (routine x 2)     Status: Abnormal (Preliminary result)   Collection Time: 12/02/17  1:56 PM  Result Value Ref Range Status   Specimen Description BLOOD SITE NOT SPECIFIED  Final   Special Requests   Final    BOTTLES DRAWN AEROBIC AND ANAEROBIC Blood Culture adequate volume   Culture  Setup Time   Final    GRAM POSITIVE COCCI IN BOTH AEROBIC AND ANAEROBIC BOTTLES CRITICAL RESULT CALLED TO, READ BACK BY AND VERIFIED WITH: PHARMD M MACCIA [id number] MLM    Culture (A)  Final    STAPHYLOCOCCUS SPECIES (COAGULASE NEGATIVE) THE SIGNIFICANCE OF ISOLATING THIS ORGANISM FROM A SINGLE SET OF BLOOD CULTURES WHEN MULTIPLE SETS ARE DRAWN IS UNCERTAIN. PLEASE NOTIFY THE MICROBIOLOGY DEPARTMENT WITHIN ONE WEEK IF SPECIATION AND SENSITIVITIES ARE REQUIRED. Performed at [facility], [address] [place], [place] [address]    Report Status PENDING  Incomplete  Blood Culture ID Panel (Reflexed)     Status: Abnormal   Collection Time: 12/02/17  1:56 PM  Result Value Ref Range Status   Enterococcus species NOT DETECTED NOT DETECTED Final   Listeria monocytogenes NOT DETECTED NOT DETECTED Final   Staphylococcus species DETECTED (A) NOT DETECTED Final    Comment: Methicillin (oxacillin) resistant coagulase negative staphylococcus. Possible blood culture  contaminant (unless isolated from more than one blood culture draw or clinical case suggests pathogenicity). No antibiotic treatment is indicated for blood  culture contaminants. CRITICAL RESULT CALLED TO, READ BACK BY AND VERIFIED WITH: PHARMD M MACCIA [id number] MLM    Staphylococcus aureus (BCID) NOT DETECTED NOT DETECTED Final   Methicillin resistance DETECTED (A) NOT DETECTED Final    Comment: CRITICAL RESULT CALLED TO, READ BACK BY AND VERIFIED WITH: PHARMD M MACCIA [id number] MLM    Streptococcus species NOT DETECTED NOT DETECTED Final   Streptococcus agalactiae NOT DETECTED NOT DETECTED Final   Streptococcus pneumoniae NOT DETECTED NOT DETECTED Final   Streptococcus pyogenes NOT DETECTED NOT DETECTED Final   Acinetobacter baumannii NOT DETECTED NOT DETECTED Final   Enterobacteriaceae species NOT DETECTED NOT DETECTED Final   Enterobacter cloacae complex NOT DETECTED NOT DETECTED Final   Escherichia coli NOT DETECTED NOT DETECTED Final   Klebsiella oxytoca NOT DETECTED NOT DETECTED Final   Klebsiella pneumoniae NOT DETECTED NOT DETECTED Final   Proteus species NOT DETECTED NOT DETECTED Final   Serratia marcescens NOT DETECTED NOT DETECTED Final   Haemophilus influenzae NOT DETECTED NOT DETECTED Final   Neisseria meningitidis NOT DETECTED NOT DETECTED Final   Pseudomonas aeruginosa NOT DETECTED NOT DETECTED Final   Candida albicans NOT DETECTED NOT DETECTED Final   Candida glabrata NOT DETECTED NOT DETECTED Final   Candida krusei NOT DETECTED NOT DETECTED Final   Candida parapsilosis NOT DETECTED NOT DETECTED Final   Candida tropicalis NOT DETECTED NOT DETECTED Final    Comment: Performed at [facility], [address] [place], [place] [address]  MRSA PCR Screening     Status: Abnormal   Collection Time: 12/03/17  4:38 PM  Result Value Ref Range Status   MRSA by PCR POSITIVE (A) NEGATIVE Final    Comment:        The GeneXpert MRSA Assay (FDA approved for NASAL  specimens only), is one component of a comprehensive MRSA colonization surveillance program. It is not intended to diagnose MRSA infection nor to guide or monitor treatment for MRSA infections. CRITICAL RESULT CALLED TO, READ BACK BY AND VERIFIED WITH: Lyndle HerrlichN. GAUNLETT, RN AT 1820 ON 12/03/17 BY C. JESSUP, MLT. Performed at [facility], [address] [place], [place] [address]   Gram stain     Status: None   Collection Time: 12/04/17 10:56 AM  Result Value Ref Range Status   Specimen Description PLEURAL RIGHT  Final   Special Requests NONE  Final   Gram Stain   Final    RARE WBC PRESENT, PREDOMINANTLY MONONUCLEAR NO ORGANISMS SEEN Performed at [facility], [address] [place], [place] [address]    Report Status 12/04/2017 FINAL  Final  Culture, body fluid-bottle     Status: None (Preliminary result)   Collection Time: 12/04/17 10:56 AM  Result Value Ref Range Status   Specimen Description PLEURAL RIGHT  Final   Special Requests NONE  Final   Culture   Final  NO GROWTH 2 DAYS Performed at [facility], [address] [place], [place] [address]    Report Status PENDING  Incomplete     Labs: BNP (last 3 results) No results for input(s): BNP in the last 8760 hours. Basic Metabolic Panel: Recent Labs  Lab 12/02/17 1307 12/02/17 1315 12/03/17 0557 12/04/17 0805 12/05/17 0539 12/06/17 0504  NA 142 142 143 141 141 142  K 4.8 4.5 5.4* 3.5 3.7 3.5  CL 99 104 101 99 99 98  CO2 26  --  22 23 25 31   GLUCOSE 88 96 97 87 115* 125*  BUN 68* 69* 80* 31* 44* 20  CREATININE 7.60* 7.80* 7.93* 4.18* 4.89* 3.20*  CALCIUM 8.7*  --  8.5* 8.7* 8.6* 8.9  MG  --   --  2.4  --   --   --   PHOS  --   --  6.1*  --   --   --    Liver Function Tests: Recent Labs  Lab 12/02/17 1307  AST 19  ALT 11  ALKPHOS 95  BILITOT 1.5*  PROT 6.6  ALBUMIN 2.8*   No results for input(s): LIPASE, AMYLASE in the last 168 hours. Recent Labs  Lab 12/02/17 1307  AMMONIA 11    CBC: Recent Labs  Lab 12/02/17 1307 12/02/17 1315 12/03/17 0557 12/04/17 0805 12/05/17 0539 12/06/17 0504  WBC 10.5  --  8.8 8.2 7.1 8.9  NEUTROABS 8.7*  --  7.6  --   --   --   HGB 9.8* 9.5* 9.8* 9.4* 9.8* 10.7*  HCT 35.2* 28.0* 34.2* 31.5* 33.6* 36.5  MCV 102.3*  --  100.0 96.6 97.7 98.9  PLT 234  --  228 237 286 274   Cardiac Enzymes: No results for input(s): CKTOTAL, CKMB, CKMBINDEX, TROPONINI in the last 168 hours. BNP: Invalid input(s): POCBNP CBG: Recent Labs  Lab 12/05/17 1707 12/05/17 2025 12/06/17 0010 12/06/17 0447 12/06/17 0818  GLUCAP 105* 107* 123* 117* 106*   D-Dimer No results for input(s): DDIMER in the last 72 hours. Hgb A1c No results for input(s): HGBA1C in the last 72 hours. Lipid Profile No results for input(s): CHOL, HDL, LDLCALC, TRIG, CHOLHDL, LDLDIRECT in the last 72 hours. Thyroid function studies No results for input(s): TSH, T4TOTAL, T3FREE, THYROIDAB in the last 72 hours.  Invalid input(s): FREET3 Anemia work up No results for input(s): VITAMINB12, FOLATE, FERRITIN, TIBC, IRON, RETICCTPCT in the last 72 hours. Urinalysis    Component Value Date/Time   COLORURINE AMBER (A) 09/26/2016 0600   APPEARANCEUR TURBID (A) 09/26/2016 0600   LABSPEC 1.009 09/26/2016 0600   PHURINE 8.0 09/26/2016 0600   GLUCOSEU 50 (A) 09/26/2016 0600   HGBUR SMALL (A) 09/26/2016 0600   BILIRUBINUR NEGATIVE 09/26/2016 0600   KETONESUR NEGATIVE 09/26/2016 0600   PROTEINUR 100 (A) 09/26/2016 0600   NITRITE NEGATIVE 09/26/2016 0600   LEUKOCYTESUR LARGE (A) 09/26/2016 0600   Sepsis Labs Invalid input(s): PROCALCITONIN,  WBC,  LACTICIDVEN Microbiology Recent Results (from the past 240 hour(s))  Blood culture (routine x 2)     Status: None (Preliminary result)   Collection Time: 12/02/17  1:56 PM  Result Value Ref Range Status   Specimen Description BLOOD RIGHT HAND  Final   Special Requests   Final    BOTTLES DRAWN AEROBIC AND ANAEROBIC Blood Culture  results may not be optimal due to an inadequate volume of blood received in culture bottles   Culture   Final  NO GROWTH 4 DAYS Performed at [facility], [address], [place], [place] [address]    Report Status PENDING  Incomplete  Blood culture (routine x 2)     Status: Abnormal (Preliminary result)   Collection Time: 12/02/17  1:56 PM  Result Value Ref Range Status   Specimen Description BLOOD SITE NOT SPECIFIED  Final   Special Requests   Final    BOTTLES DRAWN AEROBIC AND ANAEROBIC Blood Culture adequate volume   Culture  Setup Time   Final    GRAM POSITIVE COCCI IN BOTH AEROBIC AND ANAEROBIC BOTTLES CRITICAL RESULT CALLED TO, READ BACK BY AND VERIFIED WITH: PHARMD M MACCIA [id number] MLM    Culture (A)  Final    STAPHYLOCOCCUS SPECIES (COAGULASE NEGATIVE) THE SIGNIFICANCE OF ISOLATING THIS ORGANISM FROM A SINGLE SET OF BLOOD CULTURES WHEN MULTIPLE SETS ARE DRAWN IS UNCERTAIN. PLEASE NOTIFY THE MICROBIOLOGY DEPARTMENT WITHIN ONE WEEK IF SPECIATION AND SENSITIVITIES ARE REQUIRED. Performed at [facility], [address], [place], [place] [address]    Report Status PENDING  Incomplete  Blood Culture ID Panel (Reflexed)     Status: Abnormal   Collection Time: 12/02/17  1:56 PM  Result Value Ref Range Status   Enterococcus species NOT DETECTED NOT DETECTED Final   Listeria monocytogenes NOT DETECTED NOT DETECTED Final   Staphylococcus species DETECTED (A) NOT DETECTED Final    Comment: Methicillin (oxacillin) resistant coagulase negative staphylococcus. Possible blood culture contaminant (unless isolated from more than one blood culture draw or clinical case suggests pathogenicity). No antibiotic treatment is indicated for blood  culture contaminants. CRITICAL RESULT CALLED TO, READ BACK BY AND VERIFIED WITH: PHARMD M MACCIA [id number] 0910 MLM    Staphylococcus aureus (BCID) NOT DETECTED NOT DETECTED Final   Methicillin resistance DETECTED (A) NOT DETECTED Final     Comment: CRITICAL RESULT CALLED TO, READ BACK BY AND VERIFIED WITH: PHARMD M MACCIA [id number] 0910 MLM    Streptococcus species NOT DETECTED NOT DETECTED Final   Streptococcus agalactiae NOT DETECTED NOT DETECTED Final   Streptococcus pneumoniae NOT DETECTED NOT DETECTED Final   Streptococcus pyogenes NOT DETECTED NOT DETECTED Final   Acinetobacter baumannii NOT DETECTED NOT DETECTED Final   Enterobacteriaceae species NOT DETECTED NOT DETECTED Final   Enterobacter cloacae complex NOT DETECTED NOT DETECTED Final   Escherichia coli NOT DETECTED NOT DETECTED Final   Klebsiella oxytoca NOT DETECTED NOT DETECTED Final   Klebsiella pneumoniae NOT DETECTED NOT DETECTED Final   Proteus species NOT DETECTED NOT DETECTED Final   Serratia marcescens NOT DETECTED NOT DETECTED Final   Haemophilus influenzae NOT DETECTED NOT DETECTED Final   Neisseria meningitidis NOT DETECTED NOT DETECTED Final   Pseudomonas aeruginosa NOT DETECTED NOT DETECTED Final   Candida albicans NOT DETECTED NOT DETECTED Final   Candida glabrata NOT DETECTED NOT DETECTED Final   Candida krusei NOT DETECTED NOT DETECTED Final   Candida parapsilosis NOT DETECTED NOT DETECTED Final   Candida tropicalis NOT DETECTED NOT DETECTED Final    Comment: Performed at [facility], [address] [place], [place] [address]  MRSA PCR Screening     Status: Abnormal   Collection Time: 12/03/17  4:38 PM  Result Value Ref Range Status   MRSA by PCR POSITIVE (A) NEGATIVE Final    Comment:        The GeneXpert MRSA Assay (FDA approved for NASAL specimens only), is one component of a comprehensive MRSA colonization surveillance program. It  is not intended to diagnose MRSA infection nor to guide or monitor treatment for MRSA infections. CRITICAL RESULT CALLED TO, READ BACK BY AND VERIFIED WITH: Lyndle Herrlich, RN AT 1820 ON 12/03/17 BY C. JESSUP, MLT. Performed at [facility], [address], [place], [place] [address]   Gram  stain     Status: None   Collection Time: 12/04/17 10:56 AM  Result Value Ref Range Status   Specimen Description PLEURAL RIGHT  Final   Special Requests NONE  Final   Gram Stain   Final    RARE WBC PRESENT, PREDOMINANTLY MONONUCLEAR NO ORGANISMS SEEN Performed at [facility], [address] [place], [place] [address]    Report Status 12/04/2017 FINAL  Final  Culture, body fluid-bottle     Status: None (Preliminary result)   Collection Time: 12/04/17 10:56 AM  Result Value Ref Range Status   Specimen Description PLEURAL RIGHT  Final   Special Requests NONE  Final   Culture   Final    NO GROWTH 2 DAYS Performed at [facility], [address] [place], [place] [address]    Report Status PENDING  Incomplete     Patient was seen and examined on the day of discharge and was found to be in stable condition. Time coordinating discharge: 40 minutes including assessment and coordination of care, as well as examination of the patient.   SIGNED:  Noralee Stain, DO [facility] Pager [phone]  If 7PM-7AM, please contact night-coverage [email or web address] Password [email or web address] 12/06/2017, 8:57 AM

## 2017-12-06 NOTE — Clinical Social Work Placement (Signed)
   CLINICAL SOCIAL WORK PLACEMENT  NOTE  Date:  12/06/2017  Patient Details  Name: Bridget Manning MRN: [id number] Date of Birth: November 27, 1945  Clinical Social Work is seeking post-discharge placement for this patient at the Skilled  Nursing Facility level of care (*CSW will initial, date and re-position this form in  chart as items are completed):      Patient/family provided with [facility] Clinical Social Work Department's list of facilities offering this level of care within the geographic area requested by the patient (or if unable, by the patient's family).  Yes   Patient/family informed of their freedom to choose among providers that offer the needed level of care, that participate in Medicare, Medicaid or managed care program needed by the patient, have an available bed and are willing to accept the patient.      Patient/family informed of [facility]'s ownership interest in [facility] and [facility], as well as of the fact that they are under no obligation to receive care at these facilities.  PASRR submitted to EDS on       PASRR number received on 12/04/17     Existing PASRR number confirmed on       FL2 transmitted to all facilities in geographic area requested by pt/family on 12/04/17     FL2 transmitted to all facilities within larger geographic area on       Patient informed that his/her managed care company has contracts with or will negotiate with certain facilities, including the following:        Yes   Patient/family informed of bed offers received.  [facility]     Physician recommends and patient chooses bed at      [facility] to be [facility] on 12/06/17.  Patient to be transferred to facility by [facility]     Patient family notified on 12/06/17 of transfer.  Name of family member notified:  [name] (sister)     PHYSICIAN Please sign DNR     Additional Comment:     _______________________________________________ Gildardo GriffesAshley M Emelly Wurtz, LCSW 12/06/2017, 10:00 AM

## 2017-12-07 LAB — CULTURE, BLOOD (ROUTINE X 2): CULTURE: NO GROWTH

## 2017-12-09 LAB — CULTURE, BLOOD (ROUTINE X 2): SPECIAL REQUESTS: ADEQUATE

## 2017-12-09 LAB — CULTURE, BODY FLUID W GRAM STAIN -BOTTLE: Culture: NO GROWTH

## 2017-12-09 NOTE — Care Management Important Message (Signed)
Important Message  Patient Details  Name: Reina FuseMarion Badie MRN: [id number] Date of Birth: [birth year]   Medicare Important Message Given:  Yes  Signed on 12/06/2017   Dorena BodoIris Jerimey Burridge 12/09/2017, 8:16 AM

## 2018-01-01 DEATH — deceased

## 2018-12-30 IMAGING — DX DG CHEST 1V PORT
1 series · 1 of 1 positions shown · non-contrast
Comparison: May 22, 2017

CLINICAL DATA: Acute mental status change.  Fever.

EXAM:
PORTABLE CHEST 1 VIEW

[chest ap]
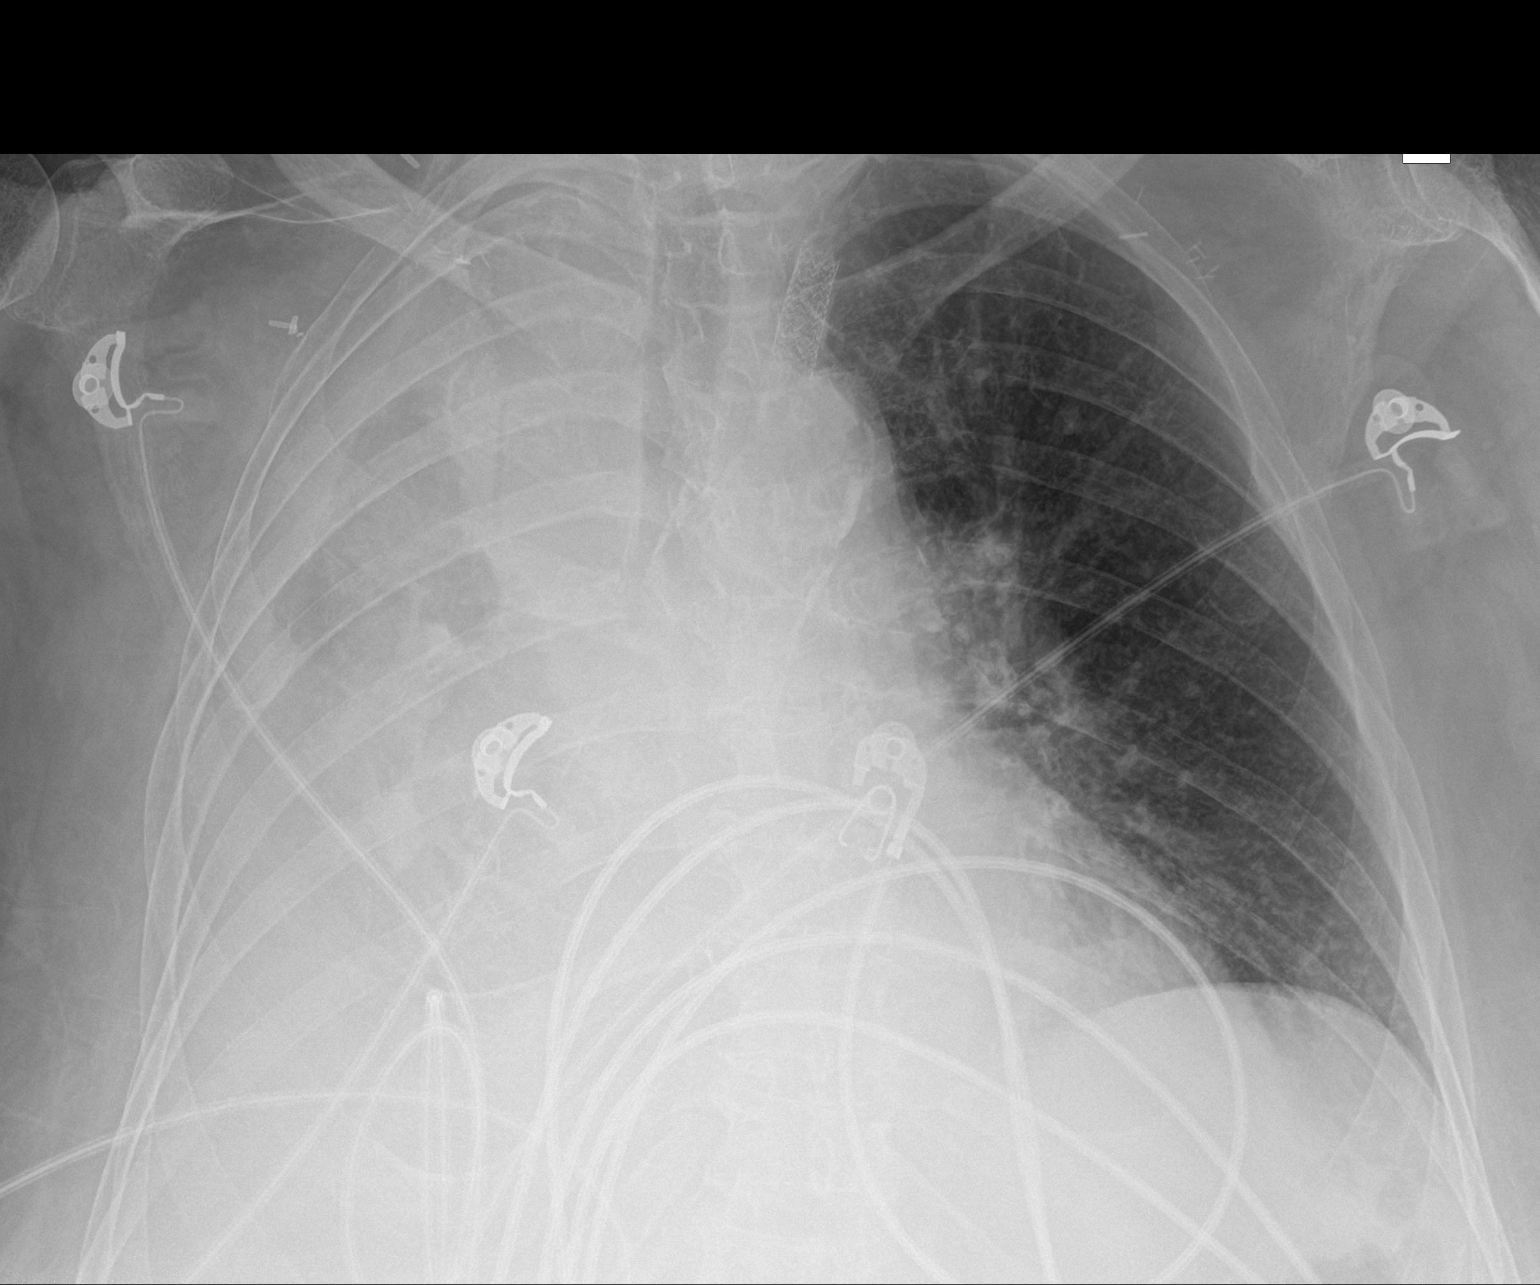

[1 of 1 positions shown; findings below may reference images not displayed]

FINDINGS: The right hemithorax is now almost completely opacified. Visualized
cardiomediastinal silhouette and left lung are normal. A left-sided
vascular stent is again identified.
IMPRESSION: Near complete opacification of the right hemithorax likely
represents fluid and underlying opacity. The finding is
indeterminate. A CT scan could further evaluate.

## 2018-12-30 IMAGING — CT CT CHEST W/O CM
2 of 4 series · 15 of 36 positions shown, 18 images · non-contrast
Comparison: 03/14/2017 chest CT, same day CXR

CLINICAL DATA: Differentiated sepsis with septic shock

EXAM:
CT CHEST WITHOUT CONTRAST
TECHNIQUE: Multidetector CT imaging of the chest was performed following the
standard protocol without IV contrast.

[Series 4: thorax 2.0 · axial · 0.78mm/px · z∈[+1243,+1501]mm · 12 of 153 slices shown, 15 images]
[im 12/153  mediastinal]
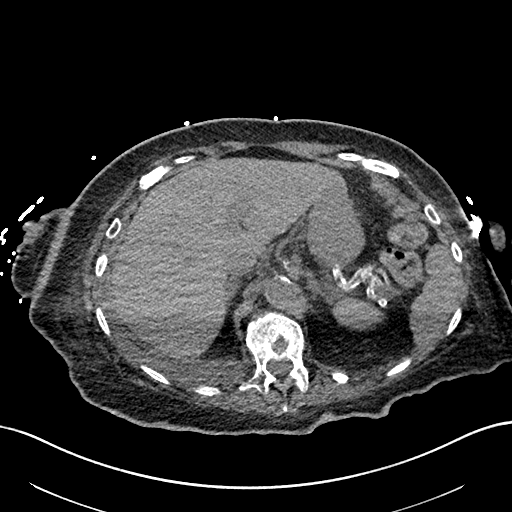
[im 12/153  lung]
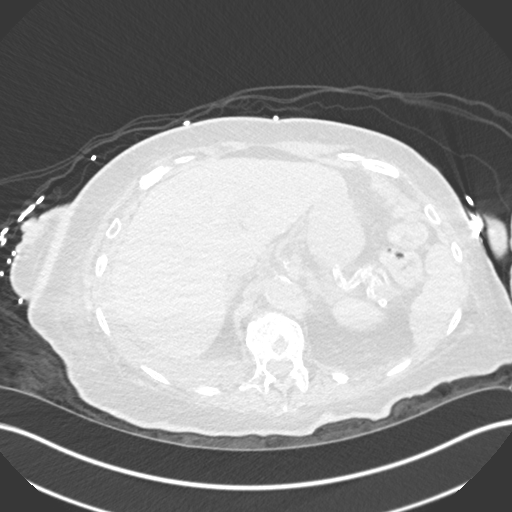
[im 24/153  lung]
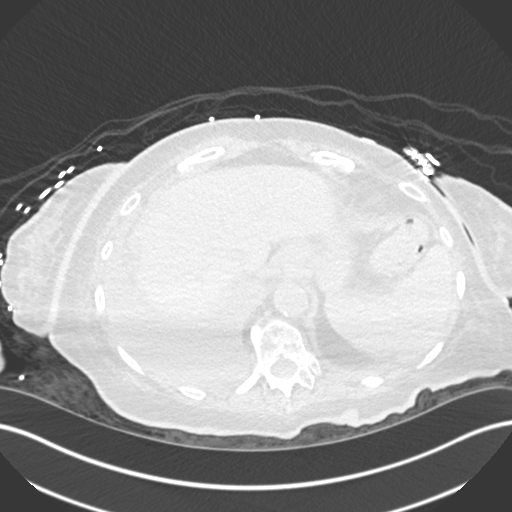
[im 36/153  lung]
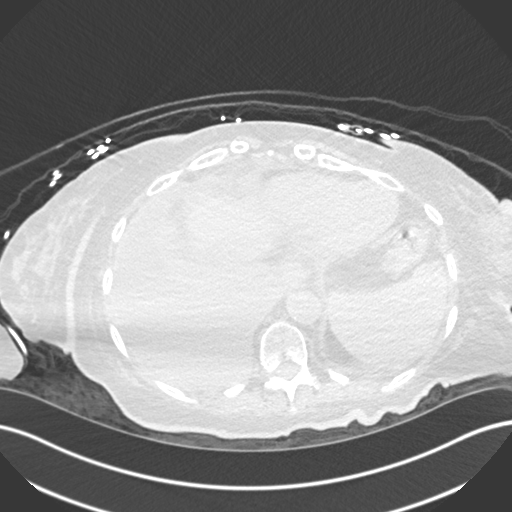
[im 47/153  lung]
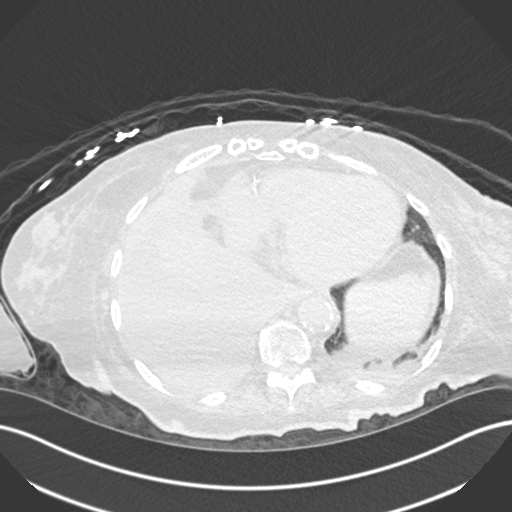
[im 59/153  mediastinal]
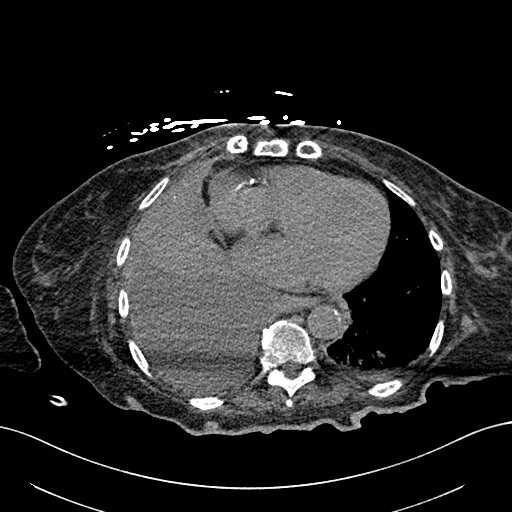
[im 59/153  lung]
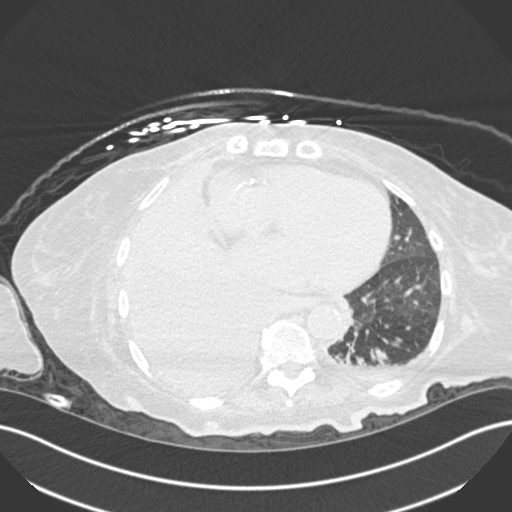
[im 71/153  lung]
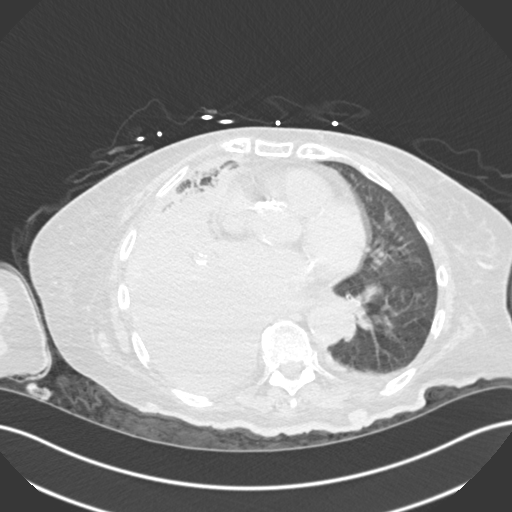
[im 82/153  lung]
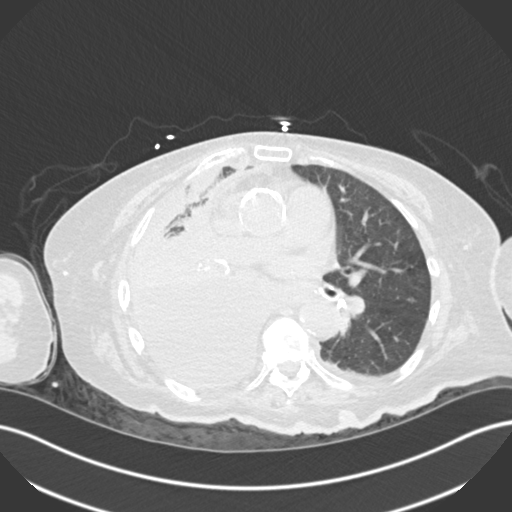
[im 94/153  lung]
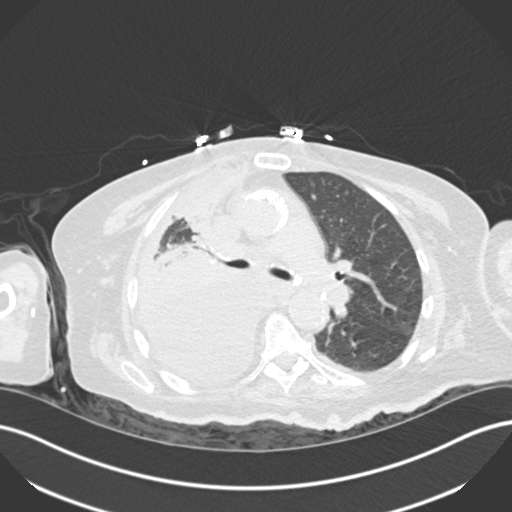
[im 106/153  mediastinal]
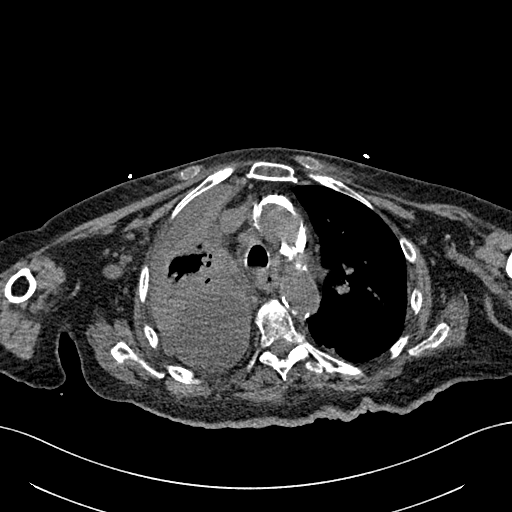
[im 106/153  lung]
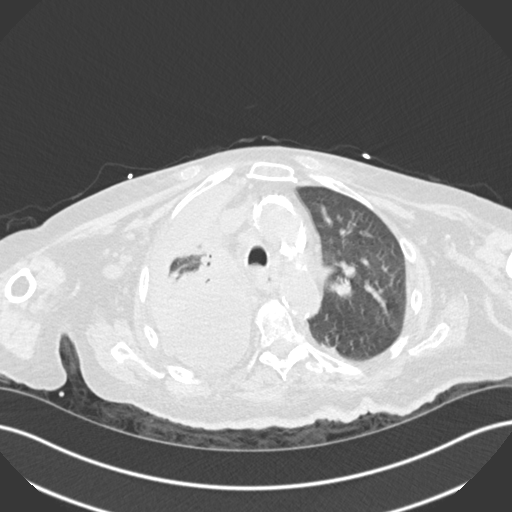
[im 117/153  lung]
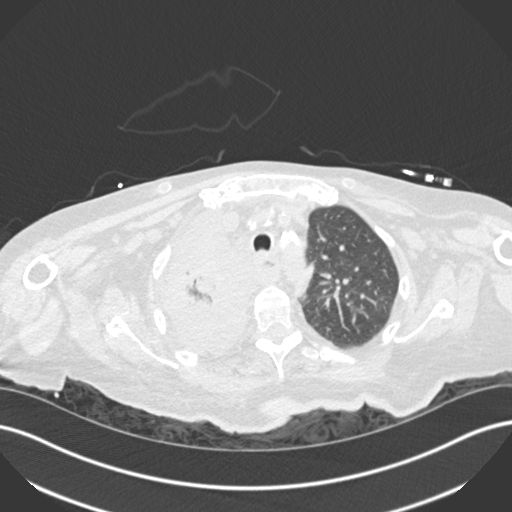
[im 129/153  lung]
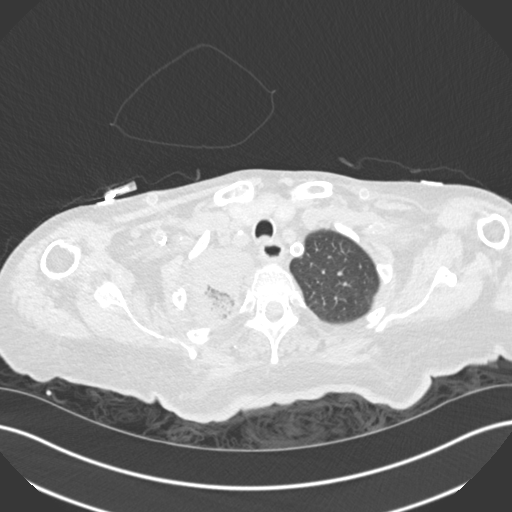
[im 141/153  lung]
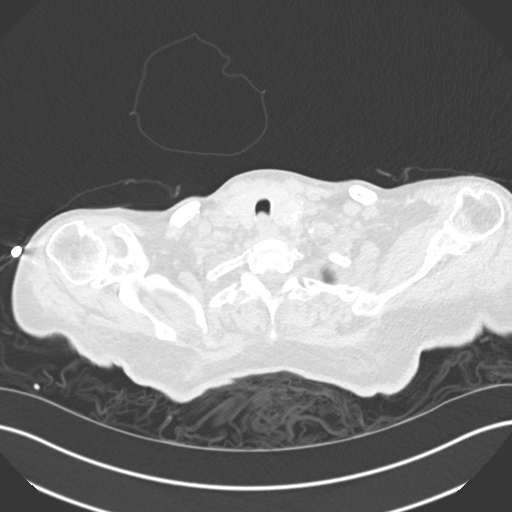

[Series 6: coronal · coronal · 0.60mm/px · 3 of 101 slices shown]
[im 21/101  lung]
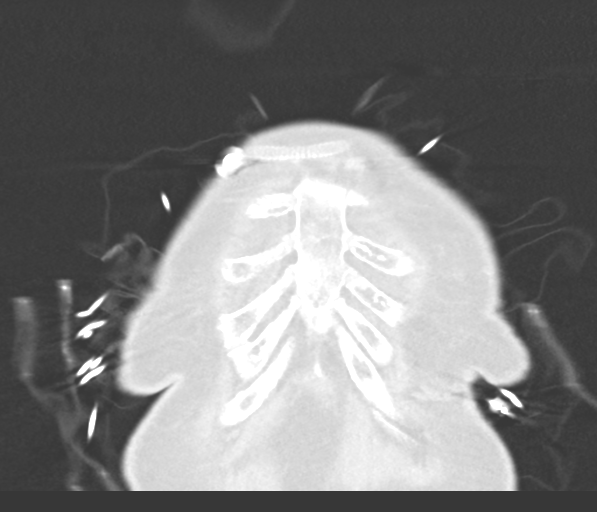
[im 41/101  lung]
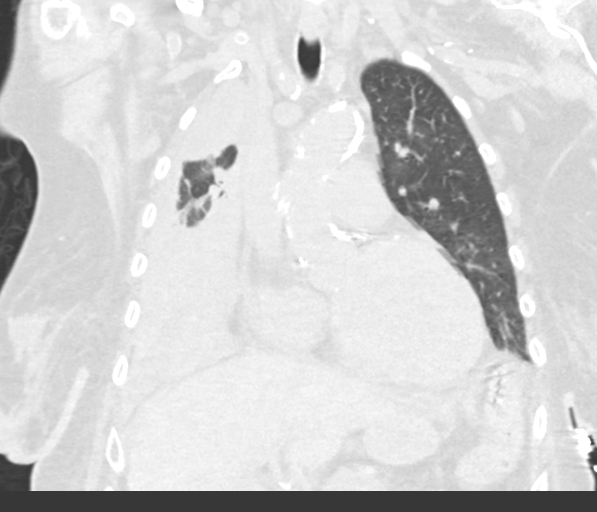
[im 61/101  lung]
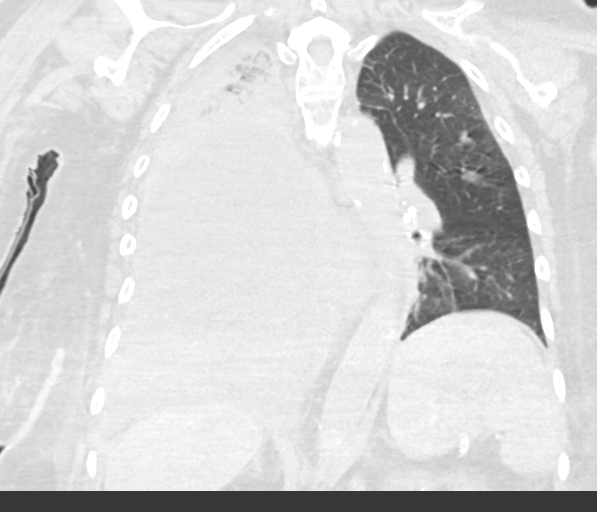

[15 of 36 positions shown; findings below may reference images not displayed]

FINDINGS: Cardiovascular: Top-normal heart size with coronary
arteriosclerosis. No pericardial effusion or thickening.
Atherosclerosis of the thoracic aorta and branch vessels with stent
in the proximal left subclavian artery. The unenhanced pulmonary
vasculature is unremarkable.

Mediastinum/Nodes: Assessment for hilar adenopathy is limited due to
lack of IV contrast. 15 mm short axis right paratracheal lymph node
is identified versus 9 mm previously. Small prevascular lymph nodes
are noted subcentimeter mass in dimension. Calcified mildly enlarged
thyroid gland consistent with multinodular goiter. Trachea is patent
with slight luminal narrowing of the right mainstem bronchus.

Lungs/Pleura: Interval increase in right-sided pleural effusion now
moderate to large with areas of loculation along the anterior
aspect. Adjacent compressive atelectasis and consolidation due to
the fusion is identified with only minimal aeration of right upper
lobe currently seen. Soft tissue debris noted within middle and
right lower lobe bronchi likely contributing to the areas of
atelectasis and consolidation. Trace atelectasis at the left lung
base.

Upper Abdomen: Redemonstration of hypodense suprarenal mass
measuring up to 4.2 cm may reflect an adrenal adenoma off the
anterior limb. This is relatively stable in appearance. Otherwise,
unremarkable upper abdomen.

Musculoskeletal: No acute osseous abnormality. Chronic compression
deformities of the thoracolumbar spine at T6, T7, T11 through L1,
greatest at L1.
IMPRESSION: 1. Interval increase in moderate to large right pleural effusion
with compressive atelectasis and pulmonary consolidation with
minimal aeration of the right upper lobe now noted.
2. Soft tissue debris/mucous impaction within right middle and lower
lobe bronchi likely contributing to postobstructive consolidation
and atelectasis.
3. Trace left effusion with atelectasis.
4. Coronary arteriosclerosis and aortic atherosclerosis. Findings
are stable in appearance. Stent in the left proximal subclavian
artery.
5. Stable left adrenal mass compatible with adenoma.
6. Chronic midthoracic the thoracolumbar compression fractures.

Aortic atherosclerosis
